# Patient Record
Sex: Male | Born: 1985 | Race: White | Hispanic: No | Marital: Married | Smoking: Never smoker
Health system: Southern US, Community
[De-identification: ages and names within clinical notes are randomized; demographics above are authoritative.]

## PROBLEM LIST (undated history)

## (undated) DIAGNOSIS — B019 Varicella without complication: Secondary | ICD-10-CM

## (undated) HISTORY — DX: Varicella without complication: B01.9

---
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
## 2015-04-23 ENCOUNTER — Encounter: Payer: Self-pay | Admitting: Family Medicine

## 2015-04-23 ENCOUNTER — Other Ambulatory Visit: Payer: Self-pay | Admitting: *Deleted

## 2015-04-23 ENCOUNTER — Ambulatory Visit (INDEPENDENT_AMBULATORY_CARE_PROVIDER_SITE_OTHER)
Admission: RE | Admit: 2015-04-23 | Discharge: 2015-04-23 | Disposition: A | Discharge status: Home / Self Care | Payer: BLUE CROSS/BLUE SHIELD | Source: Ambulatory Visit | Attending: Family Medicine | Admitting: Family Medicine

## 2015-04-23 ENCOUNTER — Other Ambulatory Visit (INDEPENDENT_AMBULATORY_CARE_PROVIDER_SITE_OTHER): Payer: BLUE CROSS/BLUE SHIELD

## 2015-04-23 ENCOUNTER — Ambulatory Visit (INDEPENDENT_AMBULATORY_CARE_PROVIDER_SITE_OTHER): Payer: BLUE CROSS/BLUE SHIELD | Admitting: Family Medicine

## 2015-04-23 VITALS — BP 106/72 | HR 63 | Ht 73.0 in | Wt 231.0 lb

## 2015-04-23 DIAGNOSIS — M5412 Radiculopathy, cervical region: Secondary | ICD-10-CM | POA: Diagnosis not present

## 2015-04-23 DIAGNOSIS — M25512 Pain in left shoulder: Secondary | ICD-10-CM

## 2015-04-23 DIAGNOSIS — M542 Cervicalgia: Secondary | ICD-10-CM | POA: Diagnosis not present

## 2015-04-23 MED ORDER — GABAPENTIN 100 MG PO CAPS: 100.0000 mg | ORAL_CAPSULE | Freq: Every day | ORAL | Status: DC

## 2015-04-23 MED ORDER — PREDNISONE 50 MG PO TABS: 50.0000 mg | ORAL_TABLET | Freq: Every day | ORAL | Status: DC

## 2015-04-23 NOTE — Progress Notes (Signed)
Tawana ScaleZach Smith D.O. [facility] [address] [place], [place] [address] Phone: [phone] Subjective:     CC: Shoulder pain, left  OZH:YQMVHQIONGHPI:Subjective Cyndee BrightlyJohn M Polio III is a 29 y.o. male coming in with complaint of shoulder pain.  This is left-sided. Patient states that this is been more of a 4-6 weeks in an insidious onset. Patient started having the difficulty and started going to a physical therapist. Patient was given some exercises and states that it may have been minorly beneficial but now it seems to be worsening again. Patient states that now he is also having constant numbness in the thumb and index finger on the left arm. Patient states that during the morning he seems to do relatively well but by the end of the day he has severe pain. States that the severity can be 7 out of 10. Denies any weakness. Patient states that certain range of motion of the neck or the shoulder can give him some mild increase in discomfort. Does respond anti-inflammatory's when he does taken. Patient would like to remain active but is concerned because of a constant numbness.  Past Medical History  Diagnosis Date  . Chicken pox    Past Surgical History  Procedure Laterality Date  . Nasal sinus surgery  2002   History  Substance Use Topics  . Smoking status: Never Smoker   . Smokeless tobacco: Never Used  . Alcohol Use: 0.0 oz/week    0 Standard drinks or equivalent per week   Not on File Family History  Problem Relation Age of Onset  . Hypertension Maternal Grandmother   . Cancer Maternal Grandfather   . Hypertension Maternal Grandfather   . Hypertension Paternal Grandmother   . Cancer Paternal Grandfather   . Hypertension Paternal Grandfather         Past medical history, social, surgical and family history all reviewed in electronic medical record.   Review of Systems: No headache, visual changes, nausea, vomiting, diarrhea, constipation, dizziness, abdominal pain, skin rash,  fevers, chills, night sweats, weight loss, swollen lymph nodes, body aches, joint swelling, muscle aches, chest pain, shortness of breath, mood changes.   Objective Blood pressure 106/72, pulse 63, height 6\' 1"  (1.854 m), weight 231 lb (104.781 kg), SpO2 98 %.  General: No apparent distress alert and oriented x3 mood and affect normal, dressed appropriately.  HEENT: Pupils equal, extraocular movements intact  Respiratory: Patient's speak in full sentences and does not appear short of breath  Cardiovascular: No lower extremity edema, non tender, no erythema  Skin: Warm dry intact with no signs of infection or rash on extremities or on axial skeleton.  Abdomen: Soft nontender  Neuro: Cranial nerves II through XII are intact, neurovascularly intact in all extremities with 2+ DTRs and 2+ pulses.  Lymph: No lymphadenopathy of posterior or anterior cervical chain or axillae bilaterally.  Gait normal with good balance and coordination.  MSK:  Non tender with full range of motion and good stability and symmetric strength and tone of  elbows, wrist, hip, knee and ankles bilaterally.  Shoulder: Left Inspection reveals no abnormalities, atrophy or asymmetry. Palpation is normal with no tenderness over AC joint or bicipital groove. Patient does have increasing range of motion but this is symmetric. Significant more internal range of motion and normal bilaterally. Rotator cuff strength normal throughout. No signs of impingement with negative Neer and Hawkin's tests, empty can sign. Speeds and Yergason's tests normal. No labral pathology noted with negative Obrien's, negative clunk  and good stability. Normal scapular function observed. No painful arc and no drop arm sign. No apprehension sign Collateral shoulder also shows increasing range of motion but otherwise unremarkable.  Neck: Inspection unremarkable. No palpable stepoffs. Mildly positive Spurling's maneuver with radicular symptoms in the C6  distribution Full neck range of motion Grip strength and sensation normal in bilateral hands Strength good C4 to T1 distribution No sensory change to C4 to T1 Negative Hoffman sign bilaterally Reflexes normal  MSK US performed of: Left shoulder This study was ordered, performed, and interpreted by Terrilee Files D.O.  Shoulder:   Supraspinatus:  Appears normal on long and transverse views, no bursal bulge seen with shoulder abduction on impingement view. Infraspinatus:  Appears normal on long and transverse views. Subscapularis: Mild increasing upper flow but no tear and minimal hypoechoic changes. Teres Minor:  Appears normal on long and transverse views. AC joint:  Capsule undistended, no geyser sign. Glenohumeral Joint:  Appears normal without effusion. Glenoid Labrum:  Intact without visualized tears. Biceps Tendon:  Appears normal on long and transverse views, no fraying of tendon, tendon located in intertubercular groove, no subluxation with shoulder internal or external rotation. No increased power doppler signal. Impression: Minimal rotator cuff syndrome  Procedure note 97110; 15 minutes spent for Therapeutic exercises as stated in above notes.  This included exercises focusing on stretching, strengthening, with significant focus on eccentric aspects. Shoulder Exercises that included:  Basic scapular stabilization to include adduction and depression of scapula Scaption, focusing on proper movement and good control Internal and External rotation utilizing a theraband, with elbow tucked at side entire time Rows with theraband  Proper technique shown and discussed handout in great detail with ATC.  All questions were discussed and answered.     Impression and Recommendations:     This case required medical decision making of moderate complexity.

## 2015-04-23 NOTE — Patient Instructions (Addendum)
Good to see you Xray downstairs Exercises 3 times a week.  Keep monitor at eye level Tennisball between shoulder blades with sitting.  Prednisone daily for 5 days  Gabapentin  at night See me again in 2 weeks to make sure you are better  Standing:  Secure a rubber exercise band/tubing so that it is at the height of your shoulders when you are either standing or sitting on a firm arm-less chair.  Grasp an end of the band/tubing in each hand and have your palms face each other. Straighten your elbows and lift your hands straight in front of you at shoulder height. Step back away from the secured end of band/tubing until it becomes tense.  Squeeze your shoulder blades together. Keeping your elbows locked and your hands at shoulder-height, bring your hands out to your side.  Hold __________ seconds. Slowly ease the tension on the band/tubing as you reverse the directions and return to the starting position. Repeat __________ times. Complete this exercise __________ times per day. STRENGTH - Scapular Retractors  Secure a rubber exercise band/tubing so that it is at the height of your shoulders when you are either standing or sitting on a firm arm-less chair.  With a palm-down grip, grasp an end of the band/tubing in each hand. Straighten your elbows and lift your hands straight in front of you at shoulder height. Step back away from the secured end of band/tubing until it becomes tense.  Squeezing your shoulder blades together, draw your elbows back as you bend them. Keep your upper arm lifted away from your body throughout the exercise.  Hold __________ seconds. Slowly ease the tension on the band/tubing as you reverse the directions and return to the starting position. Repeat __________ times. Complete this exercise __________ times per day. STRENGTH - Shoulder Extensors   Secure a rubber exercise band/tubing so that it is at the height of your shoulders when you are either standing  or sitting on a firm arm-less chair.  With a thumbs-up grip, grasp an end of the band/tubing in each hand. Straighten your elbows and lift your hands straight in front of you at shoulder height. Step back away from the secured end of band/tubing until it becomes tense.  Squeezing your shoulder blades together, pull your hands down to the sides of your thighs. Do not allow your hands to go behind you.  Hold for __________ seconds. Slowly ease the tension on the band/tubing as you reverse the directions and return to the starting position. Repeat __________ times. Complete this exercise __________ times per day.  STRENGTH - Scapular Retractors and External Rotators  Secure a rubber exercise band/tubing so that it is at the height of your shoulders when you are either standing or sitting on a firm arm-less chair.  With a palm-down grip, grasp an end of the band/tubing in each hand. Bend your elbows 90 degrees and lift your elbows to shoulder height at your sides. Step back away from the secured end of band/tubing until it becomes tense.  Squeezing your shoulder blades together, rotate your shoulder so that your upper arm and elbow remain stationary, but your fists travel upward to head-height.  Hold __________ for seconds. Slowly ease the tension on the band/tubing as you reverse the directions and return to the starting position. Repeat __________ times. Complete this exercise __________ times per day.  STRENGTH - Scapular Retractors and External Rotators, Rowing  Secure a rubber exercise band/tubing so that it is at the height of  your shoulders when you are either standing or sitting on a firm arm-less chair.  With a palm-down grip, grasp an end of the band/tubing in each hand. Straighten your elbows and lift your hands straight in front of you at shoulder height. Step back away from the secured end of band/tubing until it becomes tense.  Step 1: Squeeze your shoulder blades together. Bending  your elbows, draw your hands to your chest as if you are rowing a boat. At the end of this motion, your hands and elbow should be at shoulder-height and your elbows should be out to your sides.  Step 2: Rotate your shoulder to raise your hands above your head. Your forearms should be vertical and your upper-arms should be horizontal.  Hold for __________ seconds. Slowly ease the tension on the band/tubing as you reverse the directions and return to the starting position. Repeat __________ times. Complete this exercise __________ times per day.  STRENGTH - Scapular Retractors and Elevators  Secure a rubber exercise band/tubing so that it is at the height of your shoulders when you are either standing or sitting on a firm arm-less chair.  With a thumbs-up grip, grasp an end of the band/tubing in each hand. Step back away from the secured end of band/tubing until it becomes tense.  Squeezing your shoulder blades together, straighten your elbows and lift your hands straight over your head.  Hold for __________ seconds. Slowly ease the tension on the band/tubing as you reverse the directions and return to the starting position. Repeat __________ times. Complete this exercise __________ times per day.  Document Released: 11/07/2005 Document Revised: 01/30/2012 Document Reviewed: 02/19/2009 [facility] Patient Information 2015 [employer] [place]. This information is not intended to replace advice given to you by your health care provider. Make sure you discuss any questions you have with your health care provider.

## 2015-04-23 NOTE — Assessment & Plan Note (Signed)
Patient is having symptoms that seemed to correspond more with the C6 distribution as well as having some constant numbness of the hand. Patient continues to have pain that seems to be potentially waking him up at night as well. Seems to be concerning with a progressing over the course last month. Patient does not remember any true injury. Patient has tried anti-inflammatory's which is beneficial but he's been taking and constantly at night. I would like to treat him for potential nerve root impingement. Patient is going to be treated with prednisone and given gabapentin as well. Patient given other exercises to help with scapular stabilization due to patient's increasing range of motion of the shoulders bilaterally. We discussed ergonomics at work that could be beneficial. Patient then will come back and see me again in 2 weeks. X-rays ordered of the cervical spine to evaluate further. If patient has any worsening symptoms or no improvement with the conservative therapy advance imaging may be warranted.

## 2015-04-23 NOTE — Progress Notes (Signed)
Pre visit review using our clinic review tool, if applicable. No additional management support is needed unless otherwise documented below in the visit note. 

## 2015-04-28 ENCOUNTER — Encounter: Payer: Self-pay | Admitting: Family Medicine

## 2015-04-29 ENCOUNTER — Ambulatory Visit: Payer: Self-pay | Admitting: Family Medicine

## 2015-05-11 ENCOUNTER — Encounter: Payer: Self-pay | Admitting: Family Medicine

## 2015-05-11 ENCOUNTER — Ambulatory Visit (INDEPENDENT_AMBULATORY_CARE_PROVIDER_SITE_OTHER): Payer: BLUE CROSS/BLUE SHIELD | Admitting: Family Medicine

## 2015-05-11 VITALS — BP 118/78 | HR 72 | Ht 73.0 in | Wt 230.0 lb

## 2015-05-11 DIAGNOSIS — G540 Brachial plexus disorders: Secondary | ICD-10-CM

## 2015-05-11 DIAGNOSIS — M9902 Segmental and somatic dysfunction of thoracic region: Secondary | ICD-10-CM | POA: Diagnosis not present

## 2015-05-11 DIAGNOSIS — M9901 Segmental and somatic dysfunction of cervical region: Secondary | ICD-10-CM

## 2015-05-11 DIAGNOSIS — M999 Biomechanical lesion, unspecified: Secondary | ICD-10-CM | POA: Insufficient documentation

## 2015-05-11 DIAGNOSIS — M9908 Segmental and somatic dysfunction of rib cage: Secondary | ICD-10-CM | POA: Diagnosis not present

## 2015-05-11 NOTE — Patient Instructions (Addendum)
Good to see you Ice is your friend Vitamin D 2000 IU daily B12 1000-mcg daily B6  daily Continue the exercises and try to incorporate new ones Hope manipulation helps See me again in 3 weeks.   Thoracic Outlet Syndrome  with Rehab Thoracic outlet syndrome is a condition in which nerves, and vessels (less common), are compressed or squeezed in the chest (thoracic) cavity. This results in pain and weakness in the shoulders, arms, and hands.  SYMPTOMS   Signs of nerve damage: pain, numbness, and tingling in the arm or hand.  Arm and hand weakness.  Signs of vascular damage: coldness, inflammation, blue or pale color in the hand and fingers (uncommon). CAUSES  Thoracic outlet syndrome is caused by the squeezing of nerves, and possibly vessels, in the chest cavity, before they extend into the arm and hand. Just before leaving the chest cavity, the bundle of nerves and vessels (brachial plexus) passes by the collarbone (clavicle) and ribs. In this area, the bundle of nerves can come under increased pressure, which may result in thoracic outlet syndrome. Common sources of pressure include:  Rib cage.  Fracture of the first rib or clavicle.  Neck muscles that are too large.  Extending the arm above the head for a long period of time (during sleep or while unconscious).  Tumor (rare). RISK INCREASES WITH:  Break (fracture) of the clavicle or first rib.  Neck muscles that become too large from bodybuilding.  Fast weight loss combined with vigorous physical exercise. PREVENTION  Avoid activities where shoulder or neck injury are likely.  Wear properly fitted and padded protective equipment.  Maintain good posture.  Avoid carrying a bag or backpack that places pressure on the affected side.  Change sleeping positions. Try sleeping on one side, or sleep without a firm pillow.  Avoid building large neck muscles. PROGNOSIS  If treated properly, symptoms of thoracic outlet  syndrome normally go away with nonsurgical treatment. Sometimes, surgery is necessary to free the bundle of nerves from pressure.  RELATED COMPLICATIONS   Permanent nerve damage, including pain, numbness, tingling, or weakness.  Recurring symptoms that result in an ongoing problem.  Clotting (thrombosis) of the axillary vein in the shoulder. This is an emergency that needs to be treated immediately.  Risks of surgery: infection, bleeding, nerve damage, or damage to surrounding tissues. TREATMENT  Treatment first involves resting from activities that aggravate the symptoms and the use of ice and medicine to reduce pain and inflammation. The use of strengthening and stretching exercises may help reduce pain with activity. These exercises may be performed at home or with a therapist. It is important to improve posture and adjust sleeping habits to reduce the symptoms. If symptoms continue for more than 6 months despite nonsurgical treatment, surgery may be recommended. MEDICATION   If pain medicine is necessary, nonsteroidal anti-inflammatory medicines (aspirin and ibuprofen), or other minor pain relievers (acetaminophen), are often recommended.  Do not take pain medicine for 7 days before surgery.  Prescription pain relievers may be given if your caregiver thinks they are necessary. Use only as directed and only as much as you need. SEEK MEDICAL CARE IF:   Treatment does not help, or the condition gets worse.  Any medicines produce negative side effects.  Any complications from surgery occur:  Pain, numbness, or coldness in the affected arm and hand.  Discoloration beneath the fingernails (blue or gray) of the affected hand.  Signs of infections: fever, pain, inflammation, redness, or persistent bleeding.  EXERCISES RANGE OF MOTION (ROM) AND STRETCHING EXERCISES - Thoracic Outlet Syndrome These exercises may help you when beginning to recover from your injury. In order to successfully  stop your symptoms, you must improve your posture. These exercises are designed to help reduce the forward-head and rounded-shoulder posture that contributes to this condition. Your symptoms may go away with or without further involvement from your physician, physical therapist or athletic trainer. While completing these exercises, remember:   Restoring tissue flexibility helps return normal motion to the joints. This allows healthier, less painful movement and activity.  An effective stretch should be held for at least 30 seconds. Neck muscles are easily irritated, even if they do not seem to be bothered at the time of an exercise. It is often best to begin your stretches with hold times as short as 5 seconds and build up gradually.  A stretch should never be painful. You should only feel a gentle lengthening or release in the stretched tissue. STRETCH - Axial Extension  Stand or sit on a firm surface. Assume a good posture: chest up, shoulders drawn back, abdominal (stomach) muscles slightly tense, knees unlocked (if standing), and feet hip width apart.  Slowly pull your chin back, so your head slides back and your chin slightly lowers. Continue to look straight ahead.  You should feel a gentle stretch in the back of your head. You should be certain not to feel a strong stretch, since this can cause headaches later.  Hold for __________ seconds. Repeat __________ times. Complete this exercise __________ times per day. RANGE OF MOTION- Upper Thoracic Extension  Sit on a firm chair with a high back. Assume a good posture: chest up, shoulders drawn back, abdominal muscles slightly tense, and feet hip width apart. Place a small pillow or folded towel in the curve of your lower back, if you are having difficulty maintaining good posture.  Gently brace your neck with your hands, allowing your arms to rest on your chest.  Continue to support your neck and slowly extend your back over the chair. You  will feel a stretch across your upper back.  Hold __________ seconds. Slowly return to the starting position. Repeat __________ times. Complete this exercise __________ times per day. STRETCH - Mid-thoracic Extension  Tape two tennis balls together, or secure them side-by-side in a sock.  Find a firm surface to lie on. Position the tennis balls so that one lies on each side of your spine, and they are both between your shoulder blades. You may bend your knees if you choose.  Remain in this position for 20 to 30 seconds. Gently move your body 1 to 2 inches up (in the direction of your head), so that the balls now roll on a slightly lower part of your back. Hold this position another 20-30 seconds. Continue to move the balls down your spine until you no longer feel a stretching sensation. Repeat exercise __________ times, __________ times per day. STRENGTHENING EXERCISES - Thoracic Outlet Syndrome These exercises may help you when beginning to recover from your injury. In order to successfully stop your symptoms, you must improve your posture. These exercises are designed to help reduce the forward-head and rounded-shoulder posture that contributes to this condition. Your symptoms may go away with or without further involvement from your physician, physical therapist or athletic trainer. While completing these exercises, remember:   Muscles can gain both the endurance and the strength needed for everyday activities through controlled exercises.  Complete these  exercises as instructed by your physician, physical therapist, or athletic trainer. Increase the resistance and repetitions only as guided.  You may experience muscle soreness or fatigue, but the pain or discomfort you are trying to eliminate should never worsen during these exercises. If this pain does get worse, stop and make sure you are following the directions exactly. If the pain is still present after adjustments, stop the exercise  until you can discuss the trouble with your caretaker. STRENGTH - Scapular Retractors  Secure a rubber exercise band or tubing around a fixed object (i.e., table, pole), so that it is at the height of your shoulders when you are either standing, or sitting on a firm, armless chair.  With a palm-down grip, grasp an end of the band in each hand. Straighten your elbows out and lift your hands straight in front of you at shoulder height. Step back, away from the secured end of the band, until it becomes tense.  Squeezing your shoulder blades together, draw your elbows back as you bend them. Keep your upper arm lifted up, away from the sides of your body, throughout the exercise.  Hold __________ seconds. Slowly ease the tension on the band as you reverse the directions and return to the starting position. Repeat __________ times. Complete this exercise __________ times per day. POSTURE - Thoracic Outlet Syndrome Keeping correct posture when sitting, standing, or completing your activities will reduce the stress put on different body tissues. This will allow injured tissues a chance to heal and limit painful experiences. The following are general guidelines for improved posture. Your physician or physical therapist will provide you with any instructions specific to your needs. While reading these guidelines, remember:  The exercises prescribed by your caregiver will help you develop the flexibility and strength to maintain correct postures.  Correct posture provides the best environment for your joints to work. All your joints have less wear and tear when properly supported by a spine with good posture. This means you will experience a healthier, less painful body.  Correct posture must be practiced with all of your activities, especially prolonged sitting and standing. Correct posture is as important during repetitive, low-stress activities (i.e., typing) as it is when doing a single, heavy-load  activity (i.e., lifting). PROLONGED STANDING WHILE SLIGHTLY LEANING FORWARD When completing a task that requires you to lean forward, while standing in one place for a long time, place one foot up on a stationary 2- to 4-inch-high object to help maintain the best posture. When both feet are on the ground, the low back tends to lose its slight inward curve. If this curve flattens (or becomes too large), then the back and your other joints will experience too much stress, tire more quickly, and can cause pain.  PROLONGED ACTIVITY IN A FLEXED POSITION When completing a task that requires you to bend forward at your waist or lean over a low surface, find a way to stabilize 3 out of 4 of your limbs. You can place a hand or elbow on your thigh or rest a knee on the surface you are reaching across. This will provide you more stability so that your muscles do not tire as quickly. By keeping your knees relaxed, or slightly bent, you will also reduce stress across your low back. WALKING Walk with an upright posture. Your ears, shoulders, and hips should all line up. OFFICE WORK When working at a desk, create an environment that supports good, upright posture. Without extra support, muscles  tire, leading to too much strain on joints and other tissues.  CHAIR   A chair should be able to slide under your desk when your back makes contact with the back of the chair. This allows you to work closely.  The chair's height should allow your eyes to be level with the upper part of your monitor and your hands to be slightly lower than your elbows. BODY POSITION  Your feet should make contact with the floor. If this is not possible, use a footrest.  Keep your ears aligned over your shoulders. This will reduce stress on your neck and low back. Document Released: 11/07/2005 Document Revised: 03/24/2014 Document Reviewed: 02/19/2009 [facility] Patient Information 2015 [place], [place]. This information is not intended to  replace advice given to you by your health care provider. Make sure you discuss any questions you have with your health care provider.

## 2015-05-11 NOTE — Progress Notes (Signed)
[name] [address] [place], [place] [address] Phone: [phone] Subjective:     CC: Shoulder pain, left  [id number] Brian Carlson is a 29 y.o. male coming in with complaint of shoulder pain.  This is left-sided. Patient was seen previously and was found to have a very mild rotator cuff syndrome and was concern for more of a cervical radiculopathy. Patient though did have x-rays of his cervical spine which were unremarkable. Patient was treated for the cervical radiculopathy with prednisone as well as gabapentin to take at night. Patient states  Overall he has made some minimal improvement. Patient still is having the numbness. Patient states that the pain is completely resolved after the prednisone. Patient continues to gabapentin but unfortunately had side effects  And stopped the medication. Patient states that he can do all daily activities and is working out fairly regularly. Denies any new symptoms  Past Medical History  Diagnosis Date  . Chicken pox    Past Surgical History  Procedure Laterality Date  . Nasal sinus surgery  2002   History  Substance Use Topics  . Smoking status: Never Smoker   . Smokeless tobacco: Never Used  . Alcohol Use: 0.0 oz/week    0 Standard drinks or equivalent per week   Not on File Family History  Problem Relation Age of Onset  . Hypertension Maternal Grandmother   . Cancer Maternal Grandfather   . Hypertension Maternal Grandfather   . Hypertension Paternal Grandmother   . Cancer Paternal Grandfather   . Hypertension Paternal Grandfather         Past medical history, social, surgical and family history all reviewed in electronic medical record.   Review of Systems: No headache, visual changes, nausea, vomiting, diarrhea, constipation, dizziness, abdominal pain, skin rash, fevers, chills, night sweats, weight loss, swollen lymph nodes, body aches, joint swelling, muscle aches, chest pain, shortness  of breath, mood changes.   Objective Blood pressure 118/78, pulse 72, height 6\' 1"  (1.854 m), weight 230 lb (104.327 kg), SpO2 98 %.  General: No apparent distress alert and oriented x3 mood and affect normal, dressed appropriately.  HEENT: Pupils equal, extraocular movements intact  Respiratory: Patient's speak in full sentences and does not appear short of breath  Cardiovascular: No lower extremity edema, non tender, no erythema  Skin: Warm dry intact with no signs of infection or rash on extremities or on axial skeleton.  Abdomen: Soft nontender  Neuro: Cranial nerves II through XII are intact, neurovascularly intact in all extremities with 2+ DTRs and 2+ pulses.  Lymph: No lymphadenopathy of posterior or anterior cervical chain or axillae bilaterally.  Gait normal with good balance and coordination.  MSK:  Non tender with full range of motion and good stability and symmetric strength and tone of  elbows, wrist, hip, knee and ankles bilaterally.  Shoulder: Left Inspection reveals no abnormalities, atrophy or asymmetry. Palpation is normal with no tenderness over AC joint or bicipital groove. Patient does have increasing range of motion but this is symmetric. Significant more internal range of motion and normal bilaterally.mild increase in symptomswith shoulder extension Rotator cuff strength normal throughout. No signs of impingement with negative Neer and Hawkin's tests, empty can sign. Speeds and Yergason's tests normal. No labral pathology noted with negative Obrien's, negative clunk and good stability. Normal scapular function observed. No painful arc and no drop arm sign. No apprehension sign Collateral shoulder also shows increasing range of motion but otherwise unremarkable.  No significant change from previous exam  Neck: Inspection unremarkable. No palpable stepoffs. Minimal pain with Spurling's and no radiculopathy. Full neck range of motion Grip strength and sensation  normal in bilateral hands Strength good C4 to T1 distribution No sensory change to C4 to T1 Negative Hoffman sign bilaterally Reflexes normal  Osteopathic findings.  Cervical C2 F RS right Thoracic T3 E RS left inhaled 3rd rib T5 E RS right Lumbar  L2 F RS right     Impression and Recommendations:     This case required medical decision making of moderate complexity.

## 2015-05-11 NOTE — Assessment & Plan Note (Signed)
Decision today to treat with OMT was based on Physical Exam  After verbal consent patient was treated with HVLA, ME techniques in cervical, thoracic and rib areas  Patient tolerated the procedure well with improvement in symptoms  Patient given exercises, stretches and lifestyle modifications  See medications in patient instructions if given  Patient will follow up in 3 weeks  

## 2015-05-11 NOTE — Assessment & Plan Note (Signed)
With patient cervical spine on x-ray been fairly unremarkable and patient having more of a negative Spurling's today do not think that further advanced imaging is warranted at this time. Limited treat him more as a thoracic outlet syndrome. We discussed home exercises, icing protocol, and what activities to potentially avoid. Patient will continue with the natural supplementations and patient was given new suggestions. Patient is accompanied by wife today that could be helpful as well. Patient and will come back and see me again in 3 weeks. Patient did respond well to osteopathic manipulation.

## 2015-05-19 ENCOUNTER — Encounter: Payer: Self-pay | Admitting: Family Medicine

## 2015-06-01 ENCOUNTER — Ambulatory Visit (INDEPENDENT_AMBULATORY_CARE_PROVIDER_SITE_OTHER): Payer: BLUE CROSS/BLUE SHIELD | Admitting: Family Medicine

## 2015-06-01 ENCOUNTER — Encounter: Payer: Self-pay | Admitting: Family Medicine

## 2015-06-01 VITALS — BP 116/82 | HR 69 | Ht 73.0 in | Wt 233.0 lb

## 2015-06-01 DIAGNOSIS — G540 Brachial plexus disorders: Secondary | ICD-10-CM | POA: Diagnosis not present

## 2015-06-01 DIAGNOSIS — M9908 Segmental and somatic dysfunction of rib cage: Secondary | ICD-10-CM | POA: Diagnosis not present

## 2015-06-01 DIAGNOSIS — M9902 Segmental and somatic dysfunction of thoracic region: Secondary | ICD-10-CM | POA: Diagnosis not present

## 2015-06-01 DIAGNOSIS — M999 Biomechanical lesion, unspecified: Secondary | ICD-10-CM

## 2015-06-01 DIAGNOSIS — M9901 Segmental and somatic dysfunction of cervical region: Secondary | ICD-10-CM | POA: Diagnosis not present

## 2015-06-01 NOTE — Assessment & Plan Note (Signed)
Decision today to treat with OMT was based on Physical Exam  After verbal consent patient was treated with HVLA, ME techniques in cervical, thoracic and rib areas  Patient tolerated the procedure well with improvement in symptoms  Patient given exercises, stretches and lifestyle modifications  See medications in patient instructions if given  Patient will follow up in 3-4 weeks      

## 2015-06-01 NOTE — Assessment & Plan Note (Signed)
Patient overall is doing significantly about the same. Denise weakness but does feel like it falls asleep.  Concern still for possible cervical radiculopathy is also be contribute in. Patient may need workup including MRI of the cervical spine as well as a special MRI to rule out thoracic outlet syndrome. We discussed with patient that we should continue with the conservative therapy at this time. Patient will try gabapentin again but it has any significant side effects again we will try to switch to nortriptyline to allow for her titration. Patient will continue to monitor what other activity seems to give him more trouble. It seems that internal range of motion seems to be the worst.

## 2015-06-01 NOTE — Progress Notes (Signed)
[name] [address] [place], [place] [address] Phone: [phone] Subjective:     CC: Shoulder pain, left  [id number] Brian Carlson is a 29 y.o. male coming in with complaint of shoulder pain.  This is left-sided. Patient was seen previously and was found to have a very mild rotator cuff syndrome and was concern for more of a cervical radiculopathy. Patient though did have x-rays of his cervical spine which were unremarkable. Patient was treated for the cervical radiculopathy with prednisone as well as gabapentin to take at night. Patient was unable to tolerate the gabapentin. Patient states that it made him feel funny. Patient continues to be active playing golf as well as swimming but this unfortunately makes the problem worse. Patient is now being treated more for thoracic outlet syndrome without any significant improvement. Patient states that the manipulation does help with the radicular symptoms but unfortunately the pain in the abnormal feeling is still there.  Past Medical History  Diagnosis Date  . Chicken pox    Past Surgical History  Procedure Laterality Date  . Nasal sinus surgery  2002   History  Substance Use Topics  . Smoking status: Never Smoker   . Smokeless tobacco: Never Used  . Alcohol Use: 0.0 oz/week    0 Standard drinks or equivalent per week   Not on File Family History  Problem Relation Age of Onset  . Hypertension Maternal Grandmother   . Cancer Maternal Grandfather   . Hypertension Maternal Grandfather   . Hypertension Paternal Grandmother   . Cancer Paternal Grandfather   . Hypertension Paternal Grandfather         Past medical history, social, surgical and family history all reviewed in electronic medical record.   Review of Systems: No headache, visual changes, nausea, vomiting, diarrhea, constipation, dizziness, abdominal pain, skin rash, fevers, chills, night sweats, weight loss, swollen lymph nodes,  body aches, joint swelling, muscle aches, chest pain, shortness of breath, mood changes.   Objective Blood pressure 116/82, pulse 69, height  (1.854 m), weight 233 lb (105.688 kg), SpO2 97 %.  General: No apparent distress alert and oriented x3 mood and affect normal, dressed appropriately.  HEENT: Pupils equal, extraocular movements intact  Respiratory: Patient's speak in full sentences and does not appear short of breath  Cardiovascular: No lower extremity edema, non tender, no erythema  Skin: Warm dry intact with no signs of infection or rash on extremities or on axial skeleton.  Abdomen: Soft nontender  Neuro: Cranial nerves II through XII are intact, neurovascularly intact in all extremities with 2+ DTRs and 2+ pulses.  Lymph: No lymphadenopathy of posterior or anterior cervical chain or axillae bilaterally.  Gait normal with good balance and coordination.  MSK:  Non tender with full range of motion and good stability and symmetric strength and tone of  elbows, wrist, hip, knee and ankles bilaterally.  Shoulder: Left Inspection reveals no abnormalities, atrophy or asymmetry. Palpation is normal with no tenderness over AC joint or bicipital groove. Patient does have increasing range of motion but this is symmetric. Significant more internal range of motion and normal bilaterally.mild increase in symptomswith shoulder extension Rotator cuff strength normal throughout. No signs of impingement with negative Neer and Hawkin's tests, empty can sign. Speeds and Yergason's tests normal. No labral pathology noted with negative Obrien's, negative clunk and good stability. Normal scapular function observed. No painful arc and no drop arm sign. No apprehension sign Collateral shoulder  also shows increasing range of motion but otherwise unremarkable. No significant change from previous exam  Neck: Inspection unremarkable. No palpable stepoffs. Minimal pain with Spurling's and mild  radiculopathy down the C6 on left Full neck range of motion Grip strength and sensation normal in bilateral hands Strength good C4 to T1 distribution No sensory change to C4 to T1 Negative Hoffman sign bilaterally Reflexes normal  Osteopathic findings.  Cervical C2 F RS right Thoracic T3 E RS left inhaled 3rd rib T5 E RS right Lumbar  L2 F RS right     Impression and Recommendations:     This case required medical decision making of moderate complexity.

## 2015-06-01 NOTE — Patient Instructions (Signed)
Watch over the next 48 hours and see if manipulation is helping Try the gabapentin  Only backstroke for now Continue the exercises 3 times a week See me again in 4 weeks.

## 2015-06-01 NOTE — Progress Notes (Signed)
Pre visit review using our clinic review tool, if applicable. No additional management support is needed unless otherwise documented below in the visit note. 

## 2015-07-06 ENCOUNTER — Encounter: Payer: Self-pay | Admitting: Family Medicine

## 2015-07-06 ENCOUNTER — Ambulatory Visit (INDEPENDENT_AMBULATORY_CARE_PROVIDER_SITE_OTHER): Payer: BLUE CROSS/BLUE SHIELD | Admitting: Family Medicine

## 2015-07-06 VITALS — BP 118/70 | HR 63 | Ht 73.0 in | Wt 233.0 lb

## 2015-07-06 DIAGNOSIS — M9908 Segmental and somatic dysfunction of rib cage: Secondary | ICD-10-CM

## 2015-07-06 DIAGNOSIS — G540 Brachial plexus disorders: Secondary | ICD-10-CM

## 2015-07-06 DIAGNOSIS — M9901 Segmental and somatic dysfunction of cervical region: Secondary | ICD-10-CM

## 2015-07-06 DIAGNOSIS — M9902 Segmental and somatic dysfunction of thoracic region: Secondary | ICD-10-CM

## 2015-07-06 DIAGNOSIS — M999 Biomechanical lesion, unspecified: Secondary | ICD-10-CM

## 2015-07-06 NOTE — Assessment & Plan Note (Signed)
Patient continues to respond well to manipulation therapy for this. Encourage patient to continue to work on the scapular exercises and will opening up the chest wall. Patient will remain active and I think she will continue to do well. Patient has not taken any pain medication at this time. Patient has any worsening symptoms he will call me otherwise will come back again in 6 weeks for further evaluation and treatment.

## 2015-07-06 NOTE — Assessment & Plan Note (Signed)
Decision today to treat with OMT was based on Physical Exam  After verbal consent patient was treated with HVLA, ME techniques in cervical, thoracic and rib areas  Patient tolerated the procedure well with improvement in symptoms  Patient given exercises, stretches and lifestyle modifications  See medications in patient instructions if given  Patient will follow up in 6 weeks        

## 2015-07-06 NOTE — Progress Notes (Signed)
[name] [address] [place], [place] [address] Phone: [phone] Subjective:     CC: Shoulder pain, left  BJY:Brian Carlson Brian Carlson is a 29 y.o. male coming in with complaint of shoulder pain.  This is left-sided. Patient was seen previously and was found to have a very mild rotator cuff syndrome and was concern for more of a cervical radiculopathy. Patient though did have x-rays of his cervical spine which were unremarkable. Patient was treated for the cervical radiculopathy with prednisone as well as gabapentin to take at night. Patient was unable to tolerate the gabapentin. Patient states that it made him feel funny.  Patient continue to do the home exercises as well as help trying to stretch the thoracic outlet. Working on upper back musculature. Patient states overall he is doing better. States that it keeps moving in the right direction. Denies any weakness at this time and states that the numbness that he feels from time to time is improving as well. Patient is very happy with the results at this time. Patient has been on medication no and has not been doing exercises regularly continues to swim as well as play golf and not having significant trouble.  Past Medical History  Diagnosis Date  . Chicken pox    Past Surgical History  Procedure Laterality Date  . Nasal sinus surgery  2002   Social History  Substance Use Topics  . Smoking status: Never Smoker   . Smokeless tobacco: Never Used  . Alcohol Use: 0.0 oz/week    0 Standard drinks or equivalent per week   Not on File Family History  Problem Relation Age of Onset  . Hypertension Maternal Grandmother   . Cancer Maternal Grandfather   . Hypertension Maternal Grandfather   . Hypertension Paternal Grandmother   . Cancer Paternal Grandfather   . Hypertension Paternal Grandfather         Past medical history, social, surgical and family history all reviewed in electronic medical  record.   Review of Systems: No headache, visual changes, nausea, vomiting, diarrhea, constipation, dizziness, abdominal pain, skin rash, fevers, chills, night sweats, weight loss, swollen lymph nodes, body aches, joint swelling, muscle aches, chest pain, shortness of breath, mood changes.   Objective Blood pressure 118/70, pulse 63, height 6\' 1"  (1.854 m), weight 233 lb (105.688 kg), SpO2 97 %.  General: No apparent distress alert and oriented x3 mood and affect normal, dressed appropriately.  HEENT: Pupils equal, extraocular movements intact  Respiratory: Patient's speak in full sentences and does not appear short of breath  Cardiovascular: No lower extremity edema, non tender, no erythema  Skin: Warm dry intact with no signs of infection or rash on extremities or on axial skeleton.  Abdomen: Soft nontender  Neuro: Cranial nerves II through XII are intact, neurovascularly intact in all extremities with 2+ DTRs and 2+ pulses.  Lymph: No lymphadenopathy of posterior or anterior cervical chain or axillae bilaterally.  Gait normal with good balance and coordination.  MSK:  Non tender with full range of motion and good stability and symmetric strength and tone of  elbows, wrist, hip, knee and ankles bilaterally.  Shoulder: Left Inspection reveals no abnormalities, atrophy or asymmetry. Palpation is normal with no tenderness over AC joint or bicipital groove. Improvement in range of motion and continued increase internal range of motion bilaterally Rotator cuff strength normal throughout. No signs of impingement with negative Neer and Hawkin's tests, empty can sign. Speeds and  Yergason's tests normal. No labral pathology noted with negative Obrien's, negative clunk and good stability. Normal scapular function observed. No painful arc and no drop arm sign. No apprehension sign Collateral shoulder also shows increasing range of motion but otherwise unremarkable. No significant change from  previous exam  Neck: Inspection unremarkable. No palpable stepoffs. Negative Spurling's today Full neck range of motion Grip strength and sensation normal in bilateral hands Strength good C4 to T1 distribution No sensory change to C4 to T1 Negative Hoffman sign bilaterally Reflexes normal  Osteopathic findings.  Cervical C2 F RS right Thoracic T3 E RS left inhaled 3rd rib T5 E RS right T8 extended rotated and side bent left Lumbar  L2 F RS right     Impression and Recommendations:     This case required medical decision making of moderate complexity.

## 2015-07-06 NOTE — Progress Notes (Signed)
Pre visit review using our clinic review tool, if applicable. No additional management support is needed unless otherwise documented below in the visit note. 

## 2015-07-06 NOTE — Patient Instructions (Addendum)
Good to see you as always Continue whatever you are doing Watch the posture Stay active See me again in 6 weeks.

## 2015-08-18 ENCOUNTER — Ambulatory Visit: Payer: BLUE CROSS/BLUE SHIELD | Admitting: Family Medicine

## 2015-08-25 ENCOUNTER — Ambulatory Visit: Payer: BLUE CROSS/BLUE SHIELD | Admitting: Family Medicine

## 2015-08-25 DIAGNOSIS — Z0289 Encounter for other administrative examinations: Secondary | ICD-10-CM

## 2017-02-26 NOTE — Progress Notes (Deleted)
[name] [address] [place], [place] [address] Phone: [phone] Subjective:     CC: Shoulder pain, left f/u   [id number]  Brian Carlson is a 31 y.o. male coming in with complaint of shoulder pain.  This is left-sided. Patient was seen previously and was found to have a very mild rotator cuff syndrome and was concern for more of a cervical radiculopathy. Patient though did have x-rays of his cervical spine which were unremarkable. Patient was treated for the cervical radiculopathy with prednisone as well as gabapentin to take at night. Patient was unable to tolerate the gabapentin.  Patient was having more symptoms than of a thoracic outlet syndrome. Has been a year and a half since we seen patient. Was responding somewhat to osteopathic manipulation. Patient states  Past Medical History:  Diagnosis Date  . Chicken pox    Past Surgical History:  Procedure Laterality Date  . NASAL SINUS SURGERY  2002   Social History  Substance Use Topics  . Smoking status: Never Smoker  . Smokeless tobacco: Never Used  . Alcohol use 0.0 oz/week   Not on File Family History  Problem Relation Age of Onset  . Hypertension Maternal Grandmother   . Cancer Maternal Grandfather   . Hypertension Maternal Grandfather   . Hypertension Paternal Grandmother   . Cancer Paternal Grandfather   . Hypertension Paternal Grandfather         Past medical history, social, surgical and family history all reviewed in electronic medical record.   Review of Systems: No headache, visual changes, nausea, vomiting, diarrhea, constipation, dizziness, abdominal pain, skin rash, fevers, chills, night sweats, weight loss, swollen lymph nodes, body aches, joint swelling, muscle aches, chest pain, shortness of breath, mood changes.   Objective  There were no vitals taken for this visit.  General: No apparent distress alert and oriented x3 mood and affect normal, dressed  appropriately.  HEENT: Pupils equal, extraocular movements intact  Respiratory: Patient's speak in full sentences and does not appear short of breath  Cardiovascular: No lower extremity edema, non tender, no erythema  Skin: Warm dry intact with no signs of infection or rash on extremities or on axial skeleton.  Abdomen: Soft nontender  Neuro: Cranial nerves II through XII are intact, neurovascularly intact in all extremities with 2+ DTRs and 2+ pulses.  Lymph: No lymphadenopathy of posterior or anterior cervical chain or axillae bilaterally.  Gait normal with good balance and coordination.  MSK:  Non tender with full range of motion and good stability and symmetric strength and tone of  elbows, wrist, hip, knee and ankles bilaterally.  Shoulder: Left Inspection reveals no abnormalities, atrophy or asymmetry. Palpation is normal with no tenderness over AC joint or bicipital groove. Improvement in range of motion and continued increase internal range of motion bilaterally Rotator cuff strength normal throughout. No signs of impingement with negative Neer and Hawkin's tests, empty can sign. Speeds and Yergason's tests normal. No labral pathology noted with negative Obrien's, negative clunk and good stability. Normal scapular function observed. No painful arc and no drop arm sign. No apprehension sign Collateral shoulder also shows increasing range of motion but otherwise unremarkable. No significant change from previous exam  Neck: Inspection unremarkable. No palpable stepoffs. Negative Spurling's today Full neck range of motion Grip strength and sensation normal in bilateral hands Strength good C4 to T1 distribution No sensory change to C4 to T1 Negative Hoffman sign bilaterally Reflexes normal  Osteopathic  findings.  Cervical C2 F RS right Thoracic T3 E RS left inhaled 3rd rib T5 E RS right T8 extended rotated and side bent left Lumbar  L2 F RS right     Impression and  Recommendations:     This case required medical decision making of moderate complexity.

## 2017-02-27 ENCOUNTER — Ambulatory Visit: Payer: BLUE CROSS/BLUE SHIELD | Admitting: Family Medicine

## 2017-05-12 ENCOUNTER — Telehealth: Payer: BLUE CROSS/BLUE SHIELD | Admitting: Nurse Practitioner

## 2017-05-12 DIAGNOSIS — M898X1 Other specified disorders of bone, shoulder: Secondary | ICD-10-CM

## 2017-05-12 MED ORDER — PREDNISONE 10 MG (21) PO TBPK: ORAL_TABLET | ORAL | 0 refills | Status: DC

## 2017-05-12 MED ORDER — CYCLOBENZAPRINE HCL 10 MG PO TABS: 10.0000 mg | ORAL_TABLET | Freq: Three times a day (TID) | ORAL | 1 refills | Status: DC | PRN

## 2017-05-12 NOTE — Progress Notes (Signed)

## 2019-09-22 DIAGNOSIS — U071 COVID-19: Secondary | ICD-10-CM

## 2019-09-22 HISTORY — DX: COVID-19: U07.1

## 2019-12-05 ENCOUNTER — Encounter: Payer: Self-pay | Admitting: Internal Medicine

## 2019-12-05 ENCOUNTER — Telehealth: Payer: Self-pay | Admitting: Internal Medicine

## 2019-12-05 MED ORDER — ESOMEPRAZOLE MAGNESIUM 20 MG PO CPDR: 20.0000 mg | DELAYED_RELEASE_CAPSULE | Freq: Two times a day (BID) | ORAL | Status: DC

## 2019-12-05 NOTE — Telephone Encounter (Signed)
Brian Carlson has been having an epigastric pain at a low level that intensifies with eating at times about 10 minutes after eating some foods.  It got up to about a 7 or 8 last night after a meal of 1 beer chicken and vegetables.  There is no radiation to the back there is been a little bit of nausea but no vomiting.  He feels like a burning hot cold in the epigastrium at times.  Recent history notable for upper respiratory infection, he treated with ibuprofen he had headaches and low-grade fever and then also took some Mucinex.  Then a week or 2 after that he started to have these problems.  Tums and Pepto-Bismol have been slightly effective.  About 3 or 4 weeks ago he had some bad reflux symptoms which he has had off and on before particularly at night.  No dysphagia no odynophagia reported.   I suggested he try over-the-counter PPI twice daily for a couple of weeks.  If this is not working he will call back and we can schedule an in person appointment.  I think he probably has some sort of gastritis.  Ulcer also possible but what ever of these 2 it might be it should respond to PPI.  No other worrisome features reported.

## 2019-12-06 ENCOUNTER — Telehealth: Payer: Self-pay | Admitting: Internal Medicine

## 2019-12-06 MED ORDER — ONDANSETRON HCL 4 MG PO TABS: 4.0000 mg | ORAL_TABLET | Freq: Three times a day (TID) | ORAL | 1 refills | Status: DC | PRN

## 2019-12-06 NOTE — Telephone Encounter (Signed)
Having persistent nausea and otherwise about the same  Advised/reminded to take PPI bid  Will order ondansetron Will arrange f/u pending response

## 2020-02-20 ENCOUNTER — Other Ambulatory Visit: Payer: Self-pay | Admitting: Family Medicine

## 2020-02-20 ENCOUNTER — Ambulatory Visit (INDEPENDENT_AMBULATORY_CARE_PROVIDER_SITE_OTHER): Payer: BC Managed Care – PPO | Admitting: Family Medicine

## 2020-02-20 ENCOUNTER — Ambulatory Visit (INDEPENDENT_AMBULATORY_CARE_PROVIDER_SITE_OTHER): Payer: BC Managed Care – PPO

## 2020-02-20 ENCOUNTER — Other Ambulatory Visit: Payer: Self-pay

## 2020-02-20 ENCOUNTER — Encounter: Payer: Self-pay | Admitting: Family Medicine

## 2020-02-20 VITALS — BP 110/64 | HR 85 | Ht 73.0 in | Wt 254.6 lb

## 2020-02-20 DIAGNOSIS — M533 Sacrococcygeal disorders, not elsewhere classified: Secondary | ICD-10-CM

## 2020-02-20 DIAGNOSIS — M999 Biomechanical lesion, unspecified: Secondary | ICD-10-CM

## 2020-02-20 MED ORDER — MELOXICAM 15 MG PO TABS: 15.0000 mg | ORAL_TABLET | Freq: Every day | ORAL | 0 refills | Status: DC

## 2020-02-20 MED ORDER — TIZANIDINE HCL 4 MG PO TABS: 4.0000 mg | ORAL_TABLET | Freq: Every evening | ORAL | 2 refills | Status: AC

## 2020-02-20 NOTE — Patient Instructions (Signed)
Good to see you  Xray of hip and back today  Ice 20 minutes 2 times daily. Usually after activity and before bed. Exercises 3 times a week.  Meloxicam daily for 10 days then as  Needed Zanaflex at night for 3 nights then as needed See me again in 5-6 weeks

## 2020-02-20 NOTE — Assessment & Plan Note (Signed)
Sacroiliac dysfunction.  We will get x-rays though to rule out any impingement of the hip.  New problem.  Meloxicam and Zanaflex given, discussed icing regimen, patient is able to be active as tolerated.  Follow-up with me again in 4 to 8 weeks worsening pain consider SI injection, gluteal injection, formal physical therapy as potential treatment options.  Attempted osteopathic manipulation.

## 2020-02-20 NOTE — Progress Notes (Signed)
[facility] [address] [place] Phone: [phone] Subjective:   This visit occurred during the SARS-CoV-2 public health emergency.  Safety protocols were in place, including screening questions prior to the visit, additional usage of staff PPE, and extensive cleaning of exam room while observing appropriate contact time as indicated for disinfecting solutions.    CC: Low back and R hip pain  I, Molly Weber, LAT, ATC, am serving as scribe for Dr. Hulan Saas.  [id number]  Brian Carlson is a 34 y.o. male coming in with complaint of R hip and lower back pain for awhile that is associated w/ playing golf. Last seen for 07/06/2015 for thoracic outlet syndrome. He states that he typically feels ok when playing golf but has pain after golf.  On Monday, his low back spasamed after picking up a box from [employer].  He denies any numbness/tingling or weakness in his R LE.  Onset-  Location- R lower back, glute and R hip Duration-  Character- constant aching pain w/ intermittent sharp pain w/ aggravating activity Aggravating factors- Prolonged sitting; R sidelying; lumbar flexion Reliving factors-decreasing activity Therapies tried- Motrin; ice;  Severity- 3/10 at rest; 8/10 at worst     Past Medical History:  Diagnosis Date  . Chicken pox   . COVID-19 09/2019   Past Surgical History:  Procedure Laterality Date  . NASAL SINUS SURGERY  2002   Social History   Socioeconomic History  . Marital status: Married    Spouse name: Not on file  . Number of children: Not on file  . Years of education: Not on file  . Highest education level: Not on file  Occupational History  . Not on file  Tobacco Use  . Smoking status: Never Smoker  . Smokeless tobacco: Never Used  Substance and Sexual Activity  . Alcohol use: Yes    Alcohol/week: 0.0 standard drinks  . Drug use: No  . Sexual activity: Not on file  Other Topics Concern  . Not on  file  Social History Narrative   Married   One daughter   Software support business owner   Social Determinants of Health   Financial Resource Strain:   . Difficulty of Paying Living Expenses:   Food Insecurity:   . Worried About Charity fundraiser in the Last Year:   . Arboriculturist in the Last Year:   Transportation Needs:   . Film/video editor (Medical):   Marland Kitchen Lack of Transportation (Non-Medical):   Physical Activity:   . Days of Exercise per Week:   . Minutes of Exercise per Session:   Stress:   . Feeling of Stress :   Social Connections:   . Frequency of Communication with Friends and Family:   . Frequency of Social Gatherings with Friends and Family:   . Attends Religious Services:   . Active Member of Clubs or Organizations:   . Attends Archivist Meetings:   Marland Kitchen Marital Status:    No Known Allergies Family History  Problem Relation Age of Onset  . Hypertension Maternal Grandmother   . Cancer Maternal Grandfather   . Hypertension Maternal Grandfather   . Hypertension Paternal Grandmother   . Cancer Paternal Grandfather   . Hypertension Paternal Grandfather        Current Outpatient Medications (Analgesics):  .  meloxicam (MOBIC) 15 MG tablet, Take 1 tablet (15 mg total) by mouth daily.   Current Outpatient Medications (  Other):  .  tiZANidine (ZANAFLEX) 4 MG tablet, Take 1 tablet (4 mg total) by mouth Nightly for 10 days.   Reviewed prior external information including notes and imaging from  primary care provider As well as notes that were available from care everywhere and other healthcare systems.  Past medical history, social, surgical and family history all reviewed in electronic medical record.  No pertanent information unless stated regarding to the chief complaint.   Review of Systems:  No headache, visual changes, nausea, vomiting, diarrhea, constipation, dizziness, abdominal pain, skin rash, fevers, chills, night sweats, weight  loss, swollen lymph nodes, body aches, joint swelling, chest pain, shortness of breath, mood changes. POSITIVE muscle aches  Objective  Blood pressure 110/64, pulse 85, height 6\' 1"  (1.854 m), weight 254 lb 9.6 oz (115.5 kg), SpO2 98 %.   General: No apparent distress alert and oriented x3 mood and affect normal, dressed appropriately.  HEENT: Pupils equal, extraocular movements intact  Respiratory: Patient's speak in full sentences and does not appear short of breath  Cardiovascular: No lower extremity edema, non tender, no erythema  Neuro: Cranial nerves II through XII are intact, neurovascularly intact in all extremities with 2+ DTRs and 2+ pulses.  Gait normal with good balance and coordination.  MSK:  Non tender with full range of motion and good stability and symmetric strength and tone of shoulders, elbows, wrist, hip, knee and ankles bilaterally.  Low back exam shows the patient does have tenderness to palpation of the paraspinal musculature lumbar spine right greater than left.  Worsening pain with extension and flexion.  Negative straight leg test.  Mild positive .  Pain seems to be more localized around the right sacroiliac joints as well as the right gluteal area.  Osteopathic findings  C2 flexed rotated and side bent right C6 flexed rotated and side bent left T3 extended rotated and side bent right inhaled third rib T6 extended rotated and side bent left L2 flexed rotated and side bent right Sacrum right on right    Impression and Recommendations:     This case required medical decision making of moderate complexity. The above documentation has been reviewed and is accurate and complete Pearlean Brownie, DO       Note: This dictation was prepared with Dragon dictation along with smaller phrase technology. Any transcriptional errors that result from this process are unintentional.

## 2020-02-22 ENCOUNTER — Encounter: Payer: Self-pay | Admitting: Family Medicine

## 2020-02-22 DIAGNOSIS — M999 Biomechanical lesion, unspecified: Secondary | ICD-10-CM | POA: Insufficient documentation

## 2020-02-22 NOTE — Assessment & Plan Note (Signed)

## 2020-03-18 ENCOUNTER — Ambulatory Visit: Payer: BLUE CROSS/BLUE SHIELD | Admitting: Family Medicine

## 2020-04-03 ENCOUNTER — Encounter: Payer: Self-pay | Admitting: Family Medicine

## 2020-04-03 ENCOUNTER — Ambulatory Visit (INDEPENDENT_AMBULATORY_CARE_PROVIDER_SITE_OTHER): Payer: BC Managed Care – PPO | Admitting: Family Medicine

## 2020-04-03 ENCOUNTER — Other Ambulatory Visit: Payer: Self-pay

## 2020-04-03 DIAGNOSIS — M7061 Trochanteric bursitis, right hip: Secondary | ICD-10-CM | POA: Diagnosis not present

## 2020-04-03 DIAGNOSIS — M7071 Other bursitis of hip, right hip: Secondary | ICD-10-CM | POA: Insufficient documentation

## 2020-04-03 MED ORDER — MELOXICAM 15 MG PO TABS: 15.0000 mg | ORAL_TABLET | Freq: Every day | ORAL | 0 refills | Status: DC

## 2020-04-03 NOTE — Progress Notes (Signed)
[facility] [address] [place] Phone: [phone] Subjective:   I Brian Carlson am serving as a Education administrator for Dr. Hulan Saas.  This visit occurred during the SARS-CoV-2 public health emergency.  Safety protocols were in place, including screening questions prior to the visit, additional usage of staff PPE, and extensive cleaning of exam room while observing appropriate contact time as indicated for disinfecting solutions.   I'm seeing this patient by the request  of:  System, Pcp Not In  CC: Back pain follow-up  [id number]  Brian Carlson is a 34 y.o. male coming in with complaint of back pain. Last seen on 02/20/2020 for OMT. Patient states he is doing well. Still having some hip pain.  Patient states that the manipulation helps the lower back significantly.  Pain is only in the lateral aspect of the hip.  Only seems to give him trouble after being extremely active.  Never seems to be anything that stops him from activity.  Very stiff in the mornings.  Patient has changed beds and has done a lot of other different changes but continues to have the same discomfort.  Patient is able to play golf on a regular basis with minimal discomfort.       Past Medical History:  Diagnosis Date  . Chicken pox   . COVID-19 09/2019   Past Surgical History:  Procedure Laterality Date  . NASAL SINUS SURGERY  2002   Social History   Socioeconomic History  . Marital status: Married    Spouse name: Not on file  . Number of children: Not on file  . Years of education: Not on file  . Highest education level: Not on file  Occupational History  . Not on file  Tobacco Use  . Smoking status: Never Smoker  . Smokeless tobacco: Never Used  Substance and Sexual Activity  . Alcohol use: Yes    Alcohol/week: 0.0 standard drinks  . Drug use: No  . Sexual activity: Not on file  Other Topics Concern  . Not on file  Social History Narrative   Married   One daughter   Software support business owner   Social Determinants of Health   Financial Resource Strain:   . Difficulty of Paying Living Expenses:   Food Insecurity:   . Worried About Charity fundraiser in the Last Year:   . Arboriculturist in the Last Year:   Transportation Needs:   . Film/video editor (Medical):   Marland Kitchen Lack of Transportation (Non-Medical):   Physical Activity:   . Days of Exercise per Week:   . Minutes of Exercise per Session:   Stress:   . Feeling of Stress :   Social Connections:   . Frequency of Communication with Friends and Family:   . Frequency of Social Gatherings with Friends and Family:   . Attends Religious Services:   . Active Member of Clubs or Organizations:   . Attends Archivist Meetings:   Marland Kitchen Marital Status:    No Known Allergies Family History  Problem Relation Age of Onset  . Hypertension Maternal Grandmother   . Cancer Maternal Grandfather   . Hypertension Maternal Grandfather   . Hypertension Paternal Grandmother   . Cancer Paternal Grandfather   . Hypertension Paternal Grandfather        Current Outpatient Medications (Analgesics):  .  meloxicam (MOBIC) 15 MG tablet, Take 1 tablet (15 mg total) by mouth  daily. .  meloxicam (MOBIC) 15 MG tablet, Take 1 tablet (15 mg total) by mouth daily.     Reviewed prior external information including notes and imaging from  primary care provider As well as notes that were available from care everywhere and other healthcare systems.  Past medical history, social, surgical and family history all reviewed in electronic medical record.  No pertanent information unless stated regarding to the chief complaint.   Review of Systems:  No headache, visual changes, nausea, vomiting, diarrhea, constipation, dizziness, abdominal pain, skin rash, fevers, chills, night sweats, weight loss, swollen lymph nodes, body aches, joint swelling, chest pain, shortness of breath, mood  changes. POSITIVE muscle aches  Objective  Blood pressure 100/70, pulse 68, height 6\' 1"  (1.854 m), weight 248 lb (112.5 kg), SpO2 98 %.   General: No apparent distress alert and oriented x3 mood and affect normal, dressed appropriately.  HEENT: Pupils equal, extraocular movements intact  Respiratory: Patient's speak in full sentences and does not appear short of breath  Cardiovascular: No lower extremity edema, non tender, no erythema  Neuro: Cranial nerves II through XII are intact, neurovascularly intact in all extremities with 2+ DTRs and 2+ pulses.  Gait normal with good balance and coordination.  MSK:  Non tender with full range of motion and good stability and symmetric strength and tone of shoulders, elbows, wrist,  knee and ankles bilaterally.  Right hip exam seems to be more tender over the lateral aspect of the hip and gluteal area.  Mild positive pain with .  Patient does have pain though with resisted abduction of the hip but near full range of motion and near full strength compared to contralateral side.  Deep tendon reflexes intact.  Back is significantly improved   Impression and Recommendations:     This case required medical decision making of moderate complexity. The above documentation has been reviewed and is accurate and complete Pearlean Brownie, DO       Note: This dictation was prepared with Dragon dictation along with smaller phrase technology. Any transcriptional errors that result from this process are unintentional.

## 2020-04-03 NOTE — Patient Instructions (Addendum)
Likely bursitis or tendonitis Meloxicam 15 mg daily for a month Hip abductor strengthening  See me again in 5-6 weeks if no better I get to poke you with a needle

## 2020-04-03 NOTE — Assessment & Plan Note (Signed)
Patient has more of a bursitis.  Discussed icing regimen and home exercise, which activities to do which wants to avoid.  Discussed icing regimen and home exercises.  Patient wants to try meloxicam and prescribed today.  Worsening symptoms will consider injection.  No need for manipulation today

## 2020-05-15 ENCOUNTER — Ambulatory Visit: Payer: BC Managed Care – PPO | Admitting: Family Medicine

## 2020-06-18 ENCOUNTER — Ambulatory Visit: Payer: BC Managed Care – PPO | Admitting: Family Medicine

## 2020-06-18 NOTE — Progress Notes (Deleted)
°  [name] [address] [place] [address] Phone: [phone] Subjective:    I'm seeing this patient by the request  of:  System, Pcp Not In  CC:   IDP:[id number]   04/03/2020 Patient has more of a bursitis.  Discussed icing regimen and home exercise, which activities to do which wants to avoid.  Discussed icing regimen and home exercises.  Patient wants to try meloxicam and prescribed today.  Worsening symptoms will consider injection.  No need for manipulation today  Update 06/18/2020 Brian Carlson is a 34 y.o. male coming in with complaint of back and neck pain. Hip GT pain. Patient states   Medications patient has been prescribed:   Taking:         Reviewed prior external information including notes and imaging from previsou exam, outside providers and external EMR if available.   As well as notes that were available from care everywhere and other healthcare systems.  Past medical history, social, surgical and family history all reviewed in electronic medical record.  No pertanent information unless stated regarding to the chief complaint.   Past Medical History:  Diagnosis Date   Chicken pox    COVID-19 09/2019    No Known Allergies   Review of Systems:  No headache, visual changes, nausea, vomiting, diarrhea, constipation, dizziness, abdominal pain, skin rash, fevers, chills, night sweats, weight loss, swollen lymph nodes, body aches, joint swelling, chest pain, shortness of breath, mood changes. POSITIVE muscle aches  Objective  There were no vitals taken for this visit.   General: No apparent distress alert and oriented x3 mood and affect normal, dressed appropriately.  HEENT: Pupils equal, extraocular movements intact  Respiratory: Patient's speak in full sentences and does not appear short of breath  Cardiovascular: No lower extremity edema, non tender, no erythema  Neuro: Cranial nerves II through XII are intact,  neurovascularly intact in all extremities with 2+ DTRs and 2+ pulses.  Gait normal with good balance and coordination.  MSK:  Non tender with full range of motion and good stability and symmetric strength and tone of shoulders, elbows, wrist, hip, knee and ankles bilaterally.  Back - Normal skin, Spine with normal alignment and no deformity.  No tenderness to vertebral process palpation.  Paraspinous muscles are not tender and without spasm.   Range of motion is full at neck and lumbar sacral regions  Osteopathic findings  C2 flexed rotated and side bent right C6 flexed rotated and side bent left T3 extended rotated and side bent right inhaled rib T9 extended rotated and side bent left L2 flexed rotated and side bent right Sacrum right on right       Assessment and Plan:    Nonallopathic problems  Decision today to treat with OMT was based on Physical Exam  After verbal consent patient was treated with HVLA, ME, FPR techniques in cervical, rib, thoracic, lumbar, and sacral  areas  Patient tolerated the procedure well with improvement in symptoms  Patient given exercises, stretches and lifestyle modifications  See medications in patient instructions if given  Patient will follow up in 4-8 weeks      The above documentation has been reviewed and is accurate and complete Judi Saa, DO       Note: This dictation was prepared with Dragon dictation along with smaller phrase technology. Any transcriptional errors that result from this process are unintentional.

## 2020-10-07 ENCOUNTER — Telehealth: Payer: Self-pay | Admitting: Unknown Physician Specialty

## 2020-10-07 ENCOUNTER — Telehealth (HOSPITAL_COMMUNITY): Payer: Self-pay | Admitting: *Deleted

## 2020-10-07 ENCOUNTER — Other Ambulatory Visit: Payer: Self-pay | Admitting: Unknown Physician Specialty

## 2020-10-07 DIAGNOSIS — E663 Overweight: Secondary | ICD-10-CM

## 2020-10-07 DIAGNOSIS — U071 COVID-19: Secondary | ICD-10-CM

## 2020-10-07 NOTE — Telephone Encounter (Signed)
I connected by phone with Brian Carlson on 10/07/2020 at 4:34 PM to discuss the potential use of a new treatment for mild to moderate COVID-19 viral infection in non-hospitalized patients.  This patient is a 34 y.o. male that meets the FDA criteria for Emergency Use Authorization of COVID monoclonal antibody casirivimab/imdevimab, bamlanivimab/eteseviamb, or sotrovimab.  Has a (+) direct SARS-CoV-2 viral test result  Has mild or moderate COVID-19   Is NOT hospitalized due to COVID-19  Is within 10 days of symptom onset  Has at least one of the high risk factor(s) for progression to severe COVID-19 and/or hospitalization as defined in EUA.  Specific high risk criteria : BMI > 25   I have spoken and communicated the following to the patient or parent/caregiver regarding COVID monoclonal antibody treatment:  1. FDA has authorized the emergency use for the treatment of mild to moderate COVID-19 in adults and pediatric patients with positive results of direct SARS-CoV-2 viral testing who are 63 years of age and older weighing at least 40 kg, and who are at high risk for progressing to severe COVID-19 and/or hospitalization.  2. The significant known and potential risks and benefits of COVID monoclonal antibody, and the extent to which such potential risks and benefits are unknown.  3. Information on available alternative treatments and the risks and benefits of those alternatives, including clinical trials.  4. Patients treated with COVID monoclonal antibody should continue to self-isolate and use infection control measures (e.g., wear mask, isolate, social distance, avoid sharing personal items, clean and disinfect "high touch" surfaces, and frequent handwashing) according to CDC guidelines.   5. The patient or parent/caregiver has the option to accept or refuse COVID monoclonal antibody treatment.  After reviewing this information with the patient, the patient has agreed to receive one  of the available covid 19 monoclonal antibodies and will be provided an appropriate fact sheet prior to infusion. [name], NP 10/07/2020 4:34 PM  Sx onset 11/14

## 2020-10-07 NOTE — Telephone Encounter (Signed)
Called to Discuss with patient about Covid symptoms and the use of the monoclonal antibody infusion for those with mild to moderate Covid symptoms and at a high risk of hospitalization.     Pt appears to qualify for this infusion due to co-morbid conditions and/or a member of an at-risk group in accordance with the FDA Emergency Use Authorization.    Pt stated symptoms started on 11/15, tested positive 11/16 at [facility]. Symptoms include fever and cough. Qualifying risk factor includes BMI>25. Pt stated height 6'1" and weight 230lbs.   Pt informed an APP will be following up to verify information and if he qualifies will schedule an appointment.   Discussed estimated charges with pt and he will call insurance to see if treatment is covered.

## 2020-10-08 ENCOUNTER — Ambulatory Visit (HOSPITAL_COMMUNITY)
Admission: RE | Admit: 2020-10-08 | Discharge: 2020-10-08 | Disposition: A | Discharge status: Home / Self Care | Payer: BC Managed Care – PPO | Source: Ambulatory Visit | Attending: Pulmonary Disease | Admitting: Pulmonary Disease

## 2020-10-08 DIAGNOSIS — U071 COVID-19: Secondary | ICD-10-CM | POA: Insufficient documentation

## 2020-10-08 DIAGNOSIS — E663 Overweight: Secondary | ICD-10-CM

## 2020-10-08 MED ORDER — SODIUM CHLORIDE 0.9 % IV SOLN: INTRAVENOUS | Status: DC | PRN

## 2020-10-08 MED ORDER — FAMOTIDINE IN NACL 20-0.9 MG/50ML-% IV SOLN: 20.0000 mg | Freq: Once | INTRAVENOUS | Status: DC | PRN

## 2020-10-08 MED ORDER — METHYLPREDNISOLONE SODIUM SUCC 125 MG IJ SOLR: 125.0000 mg | Freq: Once | INTRAMUSCULAR | Status: DC | PRN

## 2020-10-08 MED ORDER — SOTROVIMAB 500 MG/8ML IV SOLN
500.0000 mg | Freq: Once | INTRAVENOUS | Status: AC
  Administered 2020-10-08: 500 mg via INTRAVENOUS

## 2020-10-08 MED ORDER — EPINEPHRINE 0.3 MG/0.3ML IJ SOAJ: 0.3000 mg | Freq: Once | INTRAMUSCULAR | Status: DC | PRN

## 2020-10-08 MED ORDER — ALBUTEROL SULFATE HFA 108 (90 BASE) MCG/ACT IN AERS: 2.0000 | INHALATION_SPRAY | Freq: Once | RESPIRATORY_TRACT | Status: DC | PRN

## 2020-10-08 MED ORDER — DIPHENHYDRAMINE HCL 50 MG/ML IJ SOLN: 50.0000 mg | Freq: Once | INTRAMUSCULAR | Status: DC | PRN

## 2020-10-08 NOTE — Progress Notes (Signed)
Diagnosis: COVID-19  Physician: Dr. Shan Levans  Procedure: Covid Infusion Clinic Med: Sotrovimab infusion - Provided patient with sotrovimab fact sheet for patients, parents, and caregivers prior to infusion. Cost of infusion was discussed with patient. Pateint agrees to continue with infusion.  Complications: No immediate complications noted  Discharge: Discharged home  If after the infusion you have any questions or concerns please call the Advanced Practice Provider at [phone]   Brian Carlson 10/08/2020

## 2020-10-08 NOTE — Discharge Instructions (Signed)

## 2021-02-07 IMAGING — DX DG LUMBAR SPINE COMPLETE 4+V
5 series · 5 of 5 positions shown · non-contrast
Comparison: None.

CLINICAL DATA: Back pain, lifting injury, golfing injury

EXAM:
LUMBAR SPINE - COMPLETE 4+ VIEW

[lumbar spine ap]
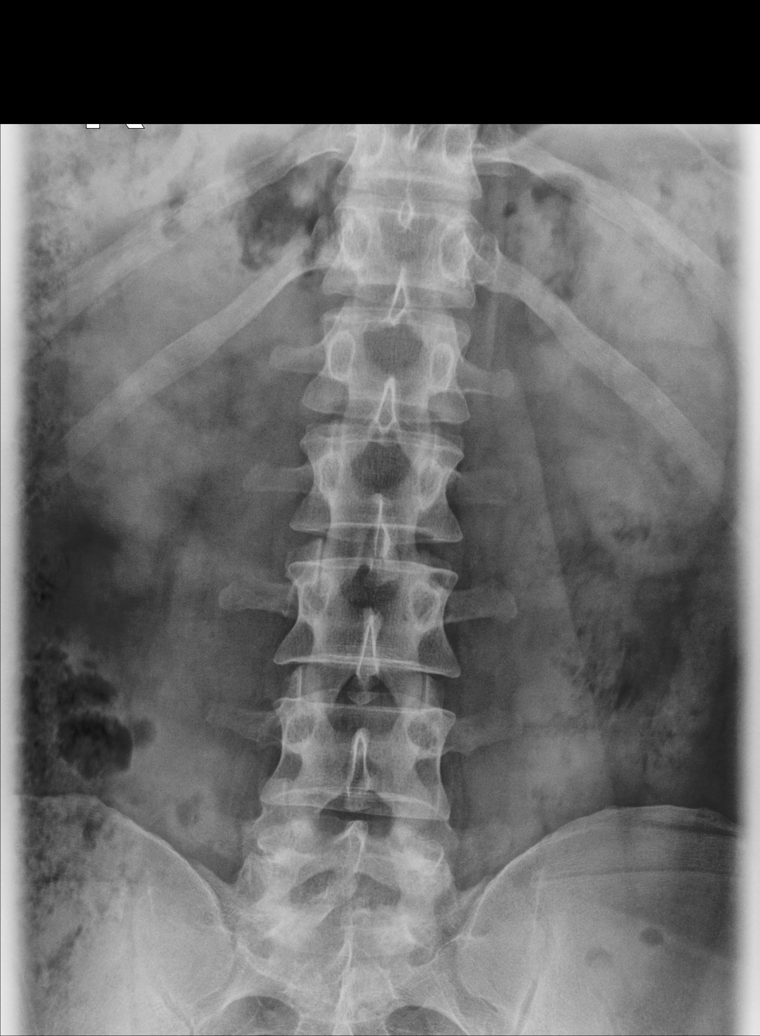

[lumbar spine mlo (1 of 2)]
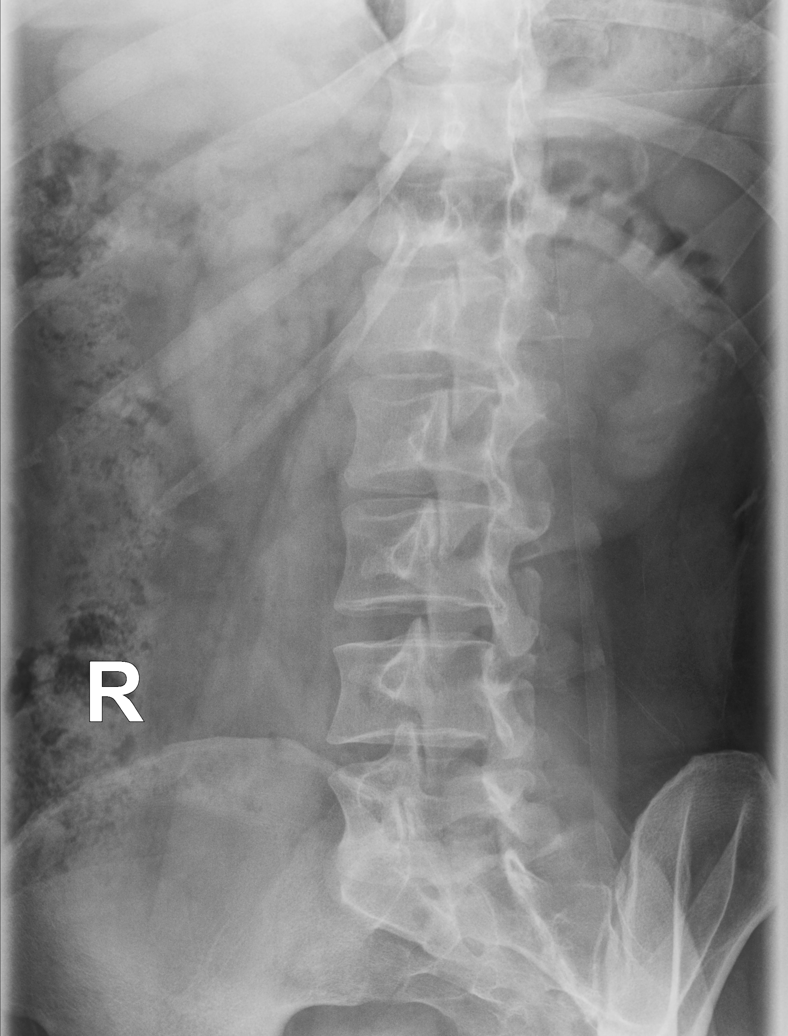

[lumbar spine mlo (2 of 2)]
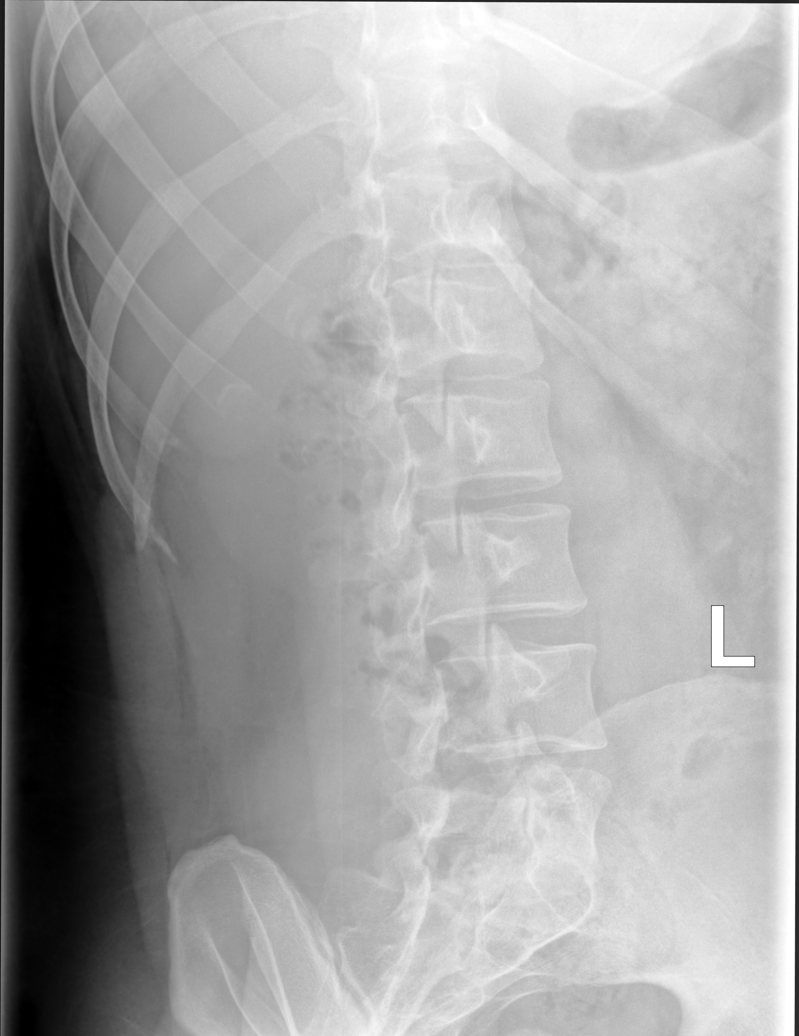

[lumbar spine lat (1 of 2)]
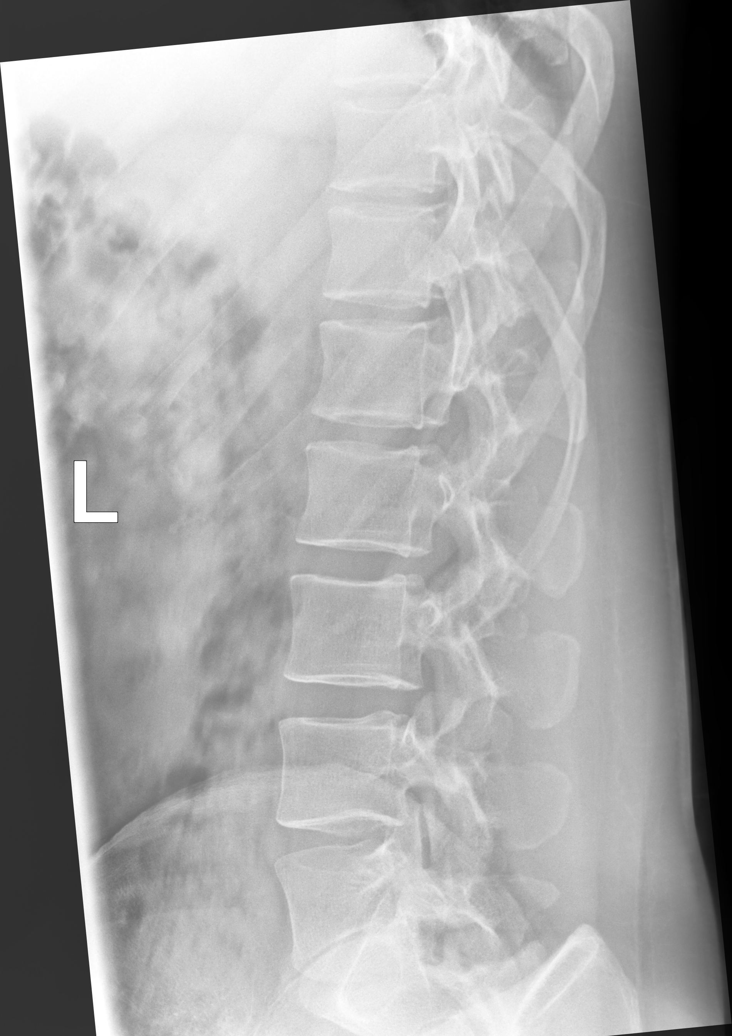

[lumbar spine lat (2 of 2)]
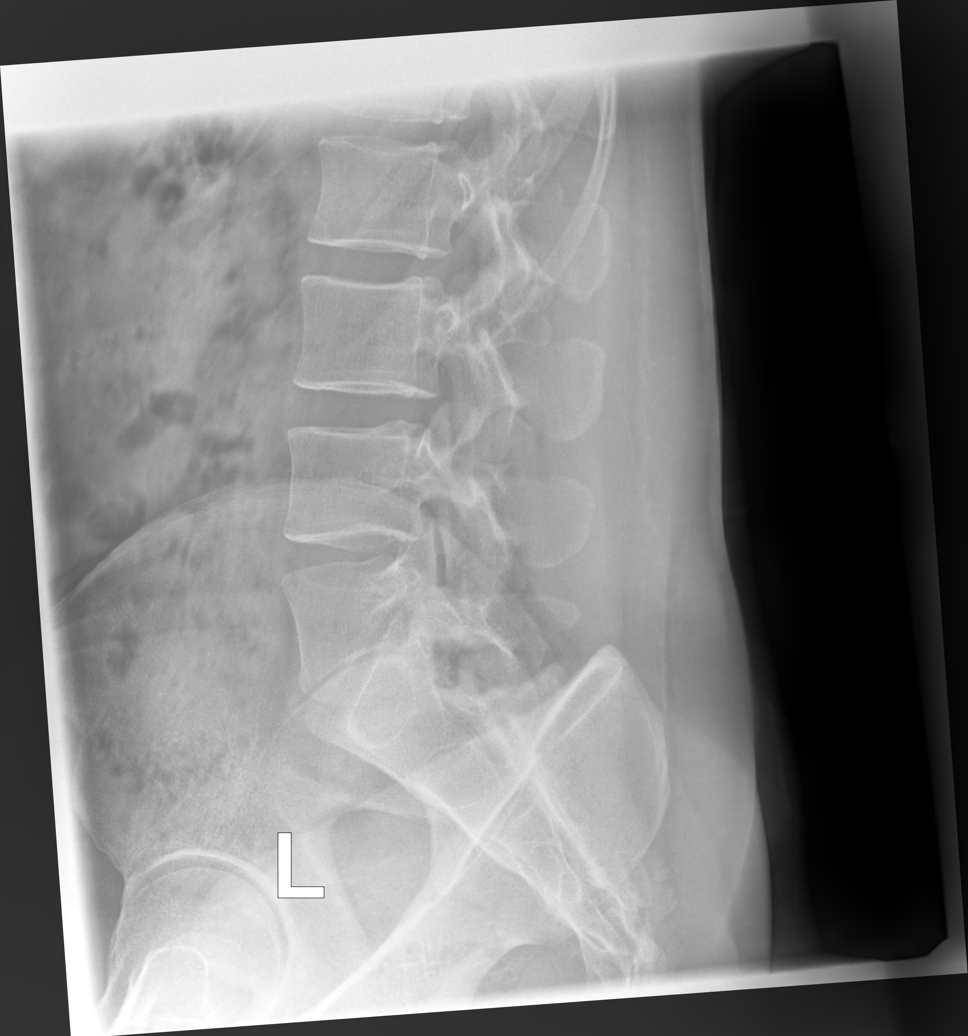

[5 of 5 positions shown; findings below may reference images not displayed]

FINDINGS: Frontal, bilateral oblique, and lateral views of the lumbar spine
are obtained. There are 5 non-rib-bearing lumbar type vertebral
bodies in grossly normal anatomic alignment. The L5/S1 disc is
hypoplastic. Remaining disc spaces are well preserved. No
significant facet hypertrophy. Sacroiliac joints are normal.
IMPRESSION: 1. Unremarkable lumbar spine.

## 2022-03-27 ENCOUNTER — Telehealth: Payer: BC Managed Care – PPO | Admitting: Nurse Practitioner

## 2022-03-27 ENCOUNTER — Encounter: Payer: Self-pay | Admitting: Nurse Practitioner

## 2022-03-27 DIAGNOSIS — B9689 Other specified bacterial agents as the cause of diseases classified elsewhere: Secondary | ICD-10-CM

## 2022-03-27 DIAGNOSIS — J329 Chronic sinusitis, unspecified: Secondary | ICD-10-CM

## 2022-03-27 MED ORDER — AMOXICILLIN-POT CLAVULANATE 875-125 MG PO TABS: 1.0000 | ORAL_TABLET | Freq: Two times a day (BID) | ORAL | 0 refills | Status: AC

## 2022-03-27 NOTE — Progress Notes (Signed)
?Virtual Visit Consent  ? ?Brian Carlson, you are scheduled for a virtual visit with a Brian Carlson provider today. Just as with appointments in the office, your consent must be obtained to participate. Your consent will be active for this visit and any virtual visit you may have with one of our providers in the next 365 days. If you have a MyChart account, a copy of this consent can be sent to you electronically. ? ?As this is a virtual visit, video technology does not allow for your provider to perform a traditional examination. This may limit your provider's ability to fully assess your condition. If your provider identifies any concerns that need to be evaluated in person or the need to arrange testing (such as labs, EKG, etc.), we will make arrangements to do so. Although advances in technology are sophisticated, we cannot ensure that it will always work on either your end or our end. If the connection with a video visit is poor, the visit may have to be switched to a telephone visit. With either a video or telephone visit, we are not always able to ensure that we have a secure connection. ? ?By engaging in this virtual visit, you consent to the provision of healthcare and authorize for your insurance to be billed (if applicable) for the services provided during this visit. Depending on your insurance coverage, you may receive a charge related to this service. ? ?I need to obtain your verbal consent now. Are you willing to proceed with your visit today? Brian Carlson has provided verbal consent on 03/27/2022 for a virtual visit (video or telephone). Brian Pounds, NP ? ?Date: 03/27/2022 5:18 PM ? ?Virtual Visit via Video Note  ? ?Brian Carlson, connected with  Brian Carlson  ([id number], 1985/12/13) on 03/27/22 at  9:45 AM EDT by a video-enabled telemedicine application and verified that I am speaking with the correct person using two identifiers. ? ?Location: ?Patient: Virtual Visit Location Patient:  Home ?Provider: Virtual Visit Location Provider: Home Office ?  ?I discussed the limitations of evaluation and management by telemedicine and the availability of in person appointments. The patient expressed understanding and agreed to proceed.   ? ?History of Present Illness: ?Brian Carlson is a 36 y.o. who identifies as a male who was assigned male at birth, and is being seen today for bacterial sinusitis ? ?Sinus Pain: Patient complains of congestion, cough described as productive of yellow and green sputum, facial pain, headache described as mild, nasal congestion, purulent nasal discharge, and sinus pressure. Onset of symptoms was 2 weeks ago, gradually worsening since that time. Past history is significant for  nasal turbinate surgery . Patient is non-smoker . ? ?1.5 weeks. Initially thought was allergies. Nasal congestion. Losing voice/hoarseness, mild headache. Green and yellow nasal discharge. Productive cough. Had nasal turbinate surgery when he was younger.  ?Zyrtec twice a day, flonase. With no improvement. Brian Carlson [address]  ? ?Problems:  ?Patient Active Problem List  ? Diagnosis Date Noted  ? Bursitis of right hip 04/03/2020  ? Nonallopathic lesion of sacral region 02/22/2020  ? Nonallopathic lesion of lumbosacral region 02/22/2020  ? 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 Thoracic outlet syndrome 05/11/2015  ? Nonallopathic lesion of thoracic region 05/11/2015  ? Nonallopathic lesion of cervical region 05/11/2015  ? Nonallopathic lesion-rib cage 05/11/2015  ? C6 radiculopathy 04/23/2015  ?  ?Allergies: No Known Allergies ?Medications:  ?Current Outpatient Medications:  ?  amoxicillin-clavulanate (AUGMENTIN) 875-125 MG tablet, Take 1 tablet by mouth 2 (two) times daily for 7 days., Disp: 14 tablet, Rfl: 0 ?  meloxicam (MOBIC) 15 MG tablet, Take 1 tablet (15 mg total) by mouth daily., Disp: 90 tablet, Rfl: 0 ? ?Observations/Objective: ?Patient is well-developed, well-nourished in no  acute distress.  ?Resting comfortably at home.  ?Head is normocephalic, atraumatic.  ?No labored breathing.  ?Speech is clear and coherent with logical content.  ?Patient is alert and oriented at baseline.  ? ? ?Assessment and Plan: ?1. Bacterial sinusitis ?- amoxicillin-clavulanate (AUGMENTIN) 875-125 MG tablet; Take 1 tablet by mouth 2 (two) times daily for 7 days.  Dispense: 14 tablet; Refill: 0 ? ?INSTRUCTIONS: use a humidifier for nasal congestion ?Drink plenty of fluids, rest and wash hands frequently to avoid the spread of infection ?Alternate tylenol and Motrin for relief of fever  ? ?Follow Up Instructions: ?I discussed the assessment and treatment plan with the patient. The patient was provided an opportunity to ask questions and all were answered. The patient agreed with the plan and demonstrated an understanding of the instructions.  A copy of instructions were sent to the patient via MyChart unless otherwise noted below.  ? ?The patient was advised to call back or seek an in-person evaluation if the symptoms worsen or if the condition fails to improve as anticipated. ? ?Time:  ?I spent 11 minutes with the patient via telehealth technology discussing the above problems/concerns.   ? ?Brian Pounds, NP  ?

## 2022-03-27 NOTE — Patient Instructions (Signed)
?  Brian Carlson, thank you for joining Claiborne Rigg, NP for today's virtual visit.  While this provider is not your primary care provider (PCP), if your PCP is located in our provider database this encounter information will be shared with them immediately following your visit. ? ?Consent: ?(Patient) Brian Carlson provided verbal consent for this virtual visit at the beginning of the encounter. ? ?Current Medications: ? ?Current Outpatient Medications:  ?  amoxicillin-clavulanate (AUGMENTIN) 875-125 MG tablet, Take 1 tablet by mouth 2 (two) times daily for 7 days., Disp: 14 tablet, Rfl: 0 ?  meloxicam (MOBIC) 15 MG tablet, Take 1 tablet (15 mg total) by mouth daily., Disp: 90 tablet, Rfl: 0  ? ?Medications ordered in this encounter:  ?Meds ordered this encounter  ?Medications  ? amoxicillin-clavulanate (AUGMENTIN) 875-125 MG tablet  ?  Sig: Take 1 tablet by mouth 2 (two) times daily for 7 days.  ?  Dispense:  14 tablet  ?  Refill:  0  ?  Order Specific Question:   Supervising Provider  ?  Answer:   Eber Hong [[id number]]  ?  ? ?*If you need refills on other medications prior to your next appointment, please contact your pharmacy* ? ?Follow-Up: ?Call back or seek an in-person evaluation if the symptoms worsen or if the condition fails to improve as anticipated. ? ?Other Instructions ?INSTRUCTIONS: use a humidifier for nasal congestion ?Drink plenty of fluids, rest and wash hands frequently to avoid the spread of infection ?Alternate tylenol and Motrin for relief of fever  ? ? ?If you have been instructed to have an in-person evaluation today at a local Urgent Care facility, please use the link below. It will take you to a list of all of our available [facility], including address, phone number and hours of operation. Please do not delay care.  ?[facility] ? ?If you or a family member do not have a primary care provider, use the link below to schedule a visit and establish care. When  you choose a [facility] primary care physician or advanced practice provider, you gain a long-term partner in health. ?Find a Primary Care Provider ? ?Learn more about [facility] in-office and virtual care options: ?[facility] Get Care Now  ?

## 2022-07-05 ENCOUNTER — Ambulatory Visit (INDEPENDENT_AMBULATORY_CARE_PROVIDER_SITE_OTHER): Payer: BC Managed Care – PPO | Admitting: Medical

## 2022-07-05 VITALS — BP 126/82 | HR 66 | Temp 98.0°F | Resp 18 | Ht 73.0 in | Wt 240.0 lb

## 2022-07-05 DIAGNOSIS — Z0000 Encounter for general adult medical examination without abnormal findings: Secondary | ICD-10-CM

## 2022-07-05 LAB — COMPREHENSIVE METABOLIC PANEL
ALT: 32 U/L (ref 0–53)
AST: 23 U/L (ref 0–37)
Albumin: 4.6 g/dL (ref 3.5–5.2)
Alkaline Phosphatase: 62 U/L (ref 39–117)
BUN: 17 mg/dL (ref 6–23)
CO2: 27 mEq/L (ref 19–32)
Calcium: 10 mg/dL (ref 8.4–10.5)
Chloride: 101 mEq/L (ref 96–112)
Creatinine, Ser: 0.89 mg/dL (ref 0.40–1.50)
GFR: 109.5 mL/min (ref >60.00)
Glucose, Bld: 90 mg/dL (ref 70–99)
Potassium: 4.2 mEq/L (ref 3.5–5.1)
Sodium: 138 mEq/L (ref 135–145)
Total Bilirubin: 0.6 mg/dL (ref 0.2–1.2)
Total Protein: 7.1 g/dL (ref 6.0–8.3)

## 2022-07-05 LAB — CBC WITH DIFFERENTIAL/PLATELET
Basophils Absolute: 0 10*3/uL (ref 0.0–0.1)
Basophils Relative: 0.5 % (ref 0.0–3.0)
Eosinophils Absolute: 0.2 10*3/uL (ref 0.0–0.7)
Eosinophils Relative: 3.8 % (ref 0.0–5.0)
HCT: 46.4 % (ref 39.0–52.0)
Hemoglobin: 15.4 g/dL (ref 13.0–17.0)
Lymphocytes Relative: 33.6 % (ref 12.0–46.0)
Lymphs Abs: 1.3 10*3/uL (ref 0.7–4.0)
MCHC: 33.2 g/dL (ref 30.0–36.0)
MCV: 86 fl (ref 78.0–100.0)
Monocytes Absolute: 0.4 10*3/uL (ref 0.1–1.0)
Monocytes Relative: 10.3 % (ref 3.0–12.0)
Neutro Abs: 2.1 10*3/uL (ref 1.4–7.7)
Neutrophils Relative %: 51.8 % (ref 43.0–77.0)
Platelets: 169 10*3/uL (ref 150.0–400.0)
RBC: 5.39 Mil/uL (ref 4.22–5.81)
RDW: 13.1 % (ref 11.5–15.5)
WBC: 4 10*3/uL (ref 4.0–10.5)

## 2022-07-05 LAB — LIPID PANEL
Cholesterol: 199 mg/dL (ref 0–200)
HDL: 50.1 mg/dL (ref >39.00)
LDL Cholesterol: 128 mg/dL — ABNORMAL HIGH (ref 0–99)
NonHDL: 148.55
Total CHOL/HDL Ratio: 4
Triglycerides: 104 mg/dL (ref 0.0–149.0)
VLDL: 20.8 mg/dL (ref 0.0–40.0)

## 2022-07-05 NOTE — Addendum Note (Signed)
Addended by: Gwenevere Abbot on: 07/05/2022 12:52 PM   Modules accepted: Orders

## 2022-07-05 NOTE — Progress Notes (Signed)
Subjective:    Patient ID: Brian Carlson, male    DOB: Mar 31, 1986, 36 y.o.   MRN: [id number]  HPI  Pt in for first time. Will go ahead and do wellness exam.   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  Eats moderate healthy. 1-2 beers a week at most. Nonsmoker. Married and 2 children.  Up to date on tetanus.     Review of Systems  Constitutional:  Negative for chills, fatigue and fever.  HENT:  Negative for congestion and drooling.   Respiratory:  Negative for chest tightness and wheezing.   Cardiovascular:  Negative for chest pain and palpitations.  Gastrointestinal:  Negative for abdominal pain.  Genitourinary:  Negative for dysuria, flank pain and frequency.  Musculoskeletal:  Negative for back pain and myalgias.  Neurological:  Negative for dizziness, weakness, numbness and headaches.  Hematological:  Negative for adenopathy. Does not bruise/bleed easily.  Psychiatric/Behavioral:  Negative for behavioral problems and confusion.     Past Medical History:  Diagnosis Date   Chicken pox    COVID-19 09/2019     Social History   Socioeconomic History   Marital status: Married    Spouse name: Not on file   Number of children: Not on file   Years of education: Not on file   Highest education level: Not on file  Occupational History   Not on file  Tobacco Use   Smoking status: Never   Smokeless tobacco: Never  Substance and Sexual Activity   Alcohol use: Yes    Alcohol/week: 0.0 standard drinks of alcohol   Drug use: No   Sexual activity: Not on file  Other Topics Concern   Not on file  Social History Narrative   Married   One daughter   Software support business owner   Social Determinants of Health   Financial Resource Strain: Not on file  Food Insecurity: Not on file  Transportation Needs: Not on file  Physical Activity: Not on file  Stress: Not on file  Social Connections: Not on file  Intimate Partner Violence: Not on file     Past Surgical History:  Procedure Laterality Date   NASAL SINUS SURGERY  2002    Family History  Problem Relation Age of Onset   Hypertension Maternal Grandmother    Cancer Maternal Grandfather    Hypertension Maternal Grandfather    Hypertension Paternal Grandmother    Cancer Paternal Grandfather    Hypertension Paternal Grandfather     Allergies  Allergen Reactions   Pertussis Vaccines Other (See Comments)    unknown    No current outpatient medications on file prior to visit.   No current facility-administered medications on file prior to visit.    BP 126/82   Pulse 66   Temp 98 F (36.7 C)   Resp 18   Ht 6\' 1"  (1.854 m)   Wt 240 lb (108.9 kg)   SpO2 99%   BMI 31.66 kg/m        Objective:   Physical Exam  General Mental Status- Alert. General Appearance- Not in acute distress.   Skin General: Color- Normal Color. Moisture- Normal Moisture.  Neck Carotid Arteries- Normal color. Moisture- Normal Moisture. No carotid bruits. No JVD.  Chest and Lung Exam Auscultation: Breath Sounds:-Normal.  Cardiovascular Auscultation:Rythm- Regular. Murmurs & Other Heart Sounds:Auscultation of the heart reveals- No Murmurs.  Abdomen Inspection:-Inspeection Normal. Palpation/Percussion:Note:No mass. Palpation and Percussion of the abdomen reveal- Non  Tender, Non Distended + BS, no rebound or guarding.   Neurologic Cranial Nerve exam:- CN Carlson-XII intact(No nystagmus), symmetric smile. Strength:- 5/5 equal and symmetric strength both upper and lower extremities.       Assessment & Plan:   Patient Instructions  For you wellness exam today I have ordered cbc, cmp and lipid panel.  For desired wt loos might consider [employer] app in combination with exercise as you already started.  Vaccine appear up to date on review.  Recommend exercise and healthy diet.  We will let you know lab results as they come in.  Follow up date appointment will be determined  after lab review.        Esperanza Richters, PA-C

## 2022-07-05 NOTE — Patient Instructions (Addendum)
For you wellness exam today I have ordered cbc, cmp and lipid panel.  For desired wt loose might consider [employer] app in combination with exercise as you already started.  Vaccine appear up to date on review.  Recommend exercise and healthy diet.  We will let you know lab results as they come in.  Follow up date appointment will be determined after lab review.     Preventive Care 24-37 Years Old, Male Preventive care refers to lifestyle choices and visits with your health care provider that can promote health and wellness. Preventive care visits are also called wellness exams. What can I expect for my preventive care visit? Counseling During your preventive care visit, your health care provider may ask about your: Medical history, including: Past medical problems. Family medical history. Current health, including: Emotional well-being. Home life and relationship well-being. Sexual activity. Lifestyle, including: Alcohol, nicotine or tobacco, and drug use. Access to firearms. Diet, exercise, and sleep habits. Safety issues such as seatbelt and bike helmet use. Sunscreen use. Work and work Astronomer. Physical exam Your health care provider may check your: Height and weight. These may be used to calculate your BMI (body mass index). BMI is a measurement that tells if you are at a healthy weight. Waist circumference. This measures the distance around your waistline. This measurement also tells if you are at a healthy weight and may help predict your risk of certain diseases, such as type 2 diabetes and high blood pressure. Heart rate and blood pressure. Body temperature. Skin for abnormal spots. What immunizations do I need?  Vaccines are usually given at various ages, according to a schedule. Your health care provider will recommend vaccines for you based on your age, medical history, and lifestyle or other factors, such as travel or where you work. What tests do I  need? Screening Your health care provider may recommend screening tests for certain conditions. This may include: Lipid and cholesterol levels. Diabetes screening. This is done by checking your blood sugar (glucose) after you have not eaten for a while (fasting). Hepatitis B test. Hepatitis C test. HIV (human immunodeficiency virus) test. STI (sexually transmitted infection) testing, if you are at risk. Talk with your health care provider about your test results, treatment options, and if necessary, the need for more tests. Follow these instructions at home: Eating and drinking  Eat a healthy diet that includes fresh fruits and vegetables, whole grains, lean protein, and low-fat dairy products. Drink enough fluid to keep your urine pale yellow. Take vitamin and mineral supplements as recommended by your health care provider. Do not drink alcohol if your health care provider tells you not to drink. If you drink alcohol: Limit how much you have to 0-2 drinks a day. Know how much alcohol is in your drink. In the [place] one drink equals one 12 oz bottle of beer (355 mL), one 5 oz glass of wine (148 mL), or one 1 oz glass of hard liquor (44 mL). Lifestyle Brush your teeth every morning and night with fluoride toothpaste. Floss one time each day. Exercise for at least 30 minutes 5 or more days each week. Do not use any products that contain nicotine or tobacco. These products include cigarettes, chewing tobacco, and vaping devices, such as e-cigarettes. If you need help quitting, ask your health care provider. Do not use drugs. If you are sexually active, practice safe sex. Use a condom or other form of protection to prevent STIs. Find healthy ways to manage stress,  such as: Meditation, yoga, or listening to music. Journaling. Talking to a trusted person. Spending time with friends and family. Minimize exposure to UV radiation to reduce your risk of skin cancer. Safety Always wear your  seat belt while driving or riding in a vehicle. Do not drive: If you have been drinking alcohol. Do not ride with someone who has been drinking. If you have been using any mind-altering substances or drugs. While texting. When you are tired or distracted. Wear a helmet and other protective equipment during sports activities. If you have firearms in your house, make sure you follow all gun safety procedures. Seek help if you have been physically or sexually abused. What's next? Go to your health care provider once a year for an annual wellness visit. Ask your health care provider how often you should have your eyes and teeth checked. Stay up to date on all vaccines. This information is not intended to replace advice given to you by your health care provider. Make sure you discuss any questions you have with your health care provider. Document Revised: 05/05/2021 Document Reviewed: 05/05/2021 Elsevier Patient Education  2023 [employer].

## 2022-09-23 DIAGNOSIS — M546 Pain in thoracic spine: Secondary | ICD-10-CM | POA: Diagnosis not present

## 2022-11-18 DIAGNOSIS — J029 Acute pharyngitis, unspecified: Secondary | ICD-10-CM | POA: Diagnosis not present

## 2022-11-18 DIAGNOSIS — J069 Acute upper respiratory infection, unspecified: Secondary | ICD-10-CM | POA: Diagnosis not present

## 2022-11-18 DIAGNOSIS — R509 Fever, unspecified: Secondary | ICD-10-CM | POA: Diagnosis not present

## 2022-11-18 DIAGNOSIS — R051 Acute cough: Secondary | ICD-10-CM | POA: Diagnosis not present

## 2023-01-14 ENCOUNTER — Other Ambulatory Visit: Payer: Self-pay | Admitting: Nurse Practitioner

## 2023-01-14 ENCOUNTER — Encounter: Payer: Self-pay | Admitting: Medical

## 2023-01-14 ENCOUNTER — Telehealth: Payer: BC Managed Care – PPO | Admitting: Physician Assistant

## 2023-01-14 DIAGNOSIS — R6889 Other general symptoms and signs: Secondary | ICD-10-CM

## 2023-01-14 DIAGNOSIS — L0100 Impetigo, unspecified: Secondary | ICD-10-CM | POA: Diagnosis not present

## 2023-01-14 MED ORDER — BENZONATATE 100 MG PO CAPS: 100.0000 mg | ORAL_CAPSULE | Freq: Three times a day (TID) | ORAL | 0 refills | Status: DC | PRN

## 2023-01-14 MED ORDER — MUPIROCIN 2 % EX OINT: 1.0000 | TOPICAL_OINTMENT | Freq: Two times a day (BID) | CUTANEOUS | 0 refills | Status: DC

## 2023-01-14 MED ORDER — MUPIROCIN CALCIUM 2 % EX CREA: TOPICAL_CREAM | Freq: Three times a day (TID) | CUTANEOUS | Status: DC

## 2023-01-14 NOTE — Progress Notes (Signed)
Virtual Visit Consent   Brian Carlson, you are scheduled for a virtual visit with a [facility] provider today. Just as with appointments in the office, your consent must be obtained to participate. Your consent will be active for this visit and any virtual visit you may have with one of our providers in the next 365 days. If you have a MyChart account, a copy of this consent can be sent to you electronically.  As this is a virtual visit, video technology does not allow for your provider to perform a traditional examination. This may limit your provider's ability to fully assess your condition. If your provider identifies any concerns that need to be evaluated in person or the need to arrange testing (such as labs, EKG, etc.), we will make arrangements to do so. Although advances in technology are sophisticated, we cannot ensure that it will always work on either your end or our end. If the connection with a video visit is poor, the visit may have to be switched to a telephone visit. With either a video or telephone visit, we are not always able to ensure that we have a secure connection.  By engaging in this virtual visit, you consent to the provision of healthcare and authorize for your insurance to be billed (if applicable) for the services provided during this visit. Depending on your insurance coverage, you may receive a charge related to this service.  I need to obtain your verbal consent now. Are you willing to proceed with your visit today? Brian Carlson has provided verbal consent on 01/14/2023 for a virtual visit (video or telephone). Lenise Arena Ward, PA-C  Date: 01/14/2023 11:48 AM  Virtual Visit via Video Note   I, Lenise Arena Ward, connected with  Brian Carlson  ([id number], August 23, 1986) on 01/14/23 at 11:45 AM EST by a video-enabled telemedicine application and verified that I am speaking with the correct person using two identifiers.  Location: Patient: Virtual Visit Location  Patient: Home Provider: Virtual Visit Location Provider: Home Office   I discussed the limitations of evaluation and management by telemedicine and the availability of in person appointments. The patient expressed understanding and agreed to proceed.    History of Present Illness: Brian Carlson is a 37 y.o. who identifies as a male who was assigned male at birth, and is being seen today for fever, chills, body aches since Tuesday. Reports intermittent dry cough.  Denies wheezing or shortness of breath.  Complains of a rash to the lower lip, reports vesicles with some yellow crusting.  Denies pain to rash.    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   Thoracic outlet syndrome 05/11/2015   Nonallopathic lesion of thoracic region 05/11/2015   Nonallopathic lesion of cervical region 05/11/2015   Nonallopathic lesion-rib cage 05/11/2015   C6 radiculopathy 04/23/2015    Allergies:  Allergies  Allergen Reactions   Pertussis Vaccines Other (See Comments)    unknown   Medications:  Current Outpatient Medications:    benzonatate (TESSALON) 100 MG capsule, Take 1 capsule (100 mg total) by mouth 3 (three) times daily as needed for cough., Disp: 20 capsule, Rfl: 0  Current Facility-Administered Medications:    mupirocin cream (BACTROBAN) 2 %, , Topical, TID, Ward,  Lenise Arena, PA-C  Observations/Objective: Patient is well-developed, well-nourished in no acute distress.  Resting comfortably at home.  Head is normocephalic, atraumatic.  No labored breathing.  Speech is clear and coherent with logical content.  Patient is alert and oriented at baseline.    Assessment and Plan: 1. Flu-like symptoms - benzonatate (TESSALON) 100 MG capsule; Take 1 capsule (100 mg total) by  mouth 3 (three) times daily as needed for cough.  Dispense: 20 capsule; Refill: 0  2. Impetigo - mupirocin cream (BACTROBAN) 2 %  Greater than 48 hours for flu like symptom onset, supportive care discussed.   Follow Up Instructions: I discussed the assessment and treatment plan with the patient. The patient was provided an opportunity to ask questions and all were answered. The patient agreed with the plan and demonstrated an understanding of the instructions.  A copy of instructions were sent to the patient via MyChart unless otherwise noted below.     The patient was advised to call back or seek an in-person evaluation if the symptoms worsen or if the condition fails to improve as anticipated.  Time:  I spent 10 minutes with the patient via telehealth technology discussing the above problems/concerns.    Lenise Arena Ward, PA-C

## 2023-01-14 NOTE — Patient Instructions (Signed)
  Brian Carlson, thank you for joining [place], PA-C for today's virtual visit.  While this provider is not your primary care provider (PCP), if your PCP is located in our provider database this encounter information will be shared with them immediately following your visit.   [facility] account gives you access to today's visit and all your visits, tests, and labs performed at [facility] " click here if you don't have a [facility] account or go to [email or web address]  Consent: (Patient) Brian Carlson provided verbal consent for this virtual visit at the beginning of the encounter.  Current Medications:  Current Outpatient Medications:    benzonatate (TESSALON) 100 MG capsule, Take 1 capsule (100 mg total) by mouth 3 (three) times daily as needed for cough., Disp: 20 capsule, Rfl: 0  Current Facility-Administered Medications:    mupirocin cream (BACTROBAN) 2 %, , Topical, TID, Ward, Lenise Arena, PA-C   Medications ordered in this encounter:  Meds ordered this encounter  Medications   benzonatate (TESSALON) 100 MG capsule    Sig: Take 1 capsule (100 mg total) by mouth 3 (three) times daily as needed for cough.    Dispense:  20 capsule    Refill:  0    Order Specific Question:   Supervising Provider    Answer:   Chase Picket [id number]   mupirocin cream (BACTROBAN) 2 %     *If you need refills on other medications prior to your next appointment, please contact your pharmacy*  Follow-Up: Call back or seek an in-person evaluation if the symptoms worsen or if the condition fails to improve as anticipated.  [facility] [phone]  Other Instructions Can take Tessalon as needed every 8 hours for cough.  Recommend Mucinex and Flonase for congestion and postnasal drip.    Apply mupirocin three times daily to rash. If no improvement follow up with in person evaluation with PCP or Urgent Care.    If you have been  instructed to have an in-person evaluation today at a local Urgent Care facility, please use the link below. It will take you to a list of all of our available [facility], including address, phone number and hours of operation. Please do not delay care.  [facility]  If you or a family member do not have a primary care provider, use the link below to schedule a visit and establish care. When you choose a [facility] primary care physician or advanced practice provider, you gain a long-term partner in health. Find a Primary Care Provider  Learn more about [facility]'s in-office and virtual care options: [facility]

## 2024-07-16 ENCOUNTER — Telehealth: Admitting: Physician Assistant

## 2024-07-16 DIAGNOSIS — J208 Acute bronchitis due to other specified organisms: Secondary | ICD-10-CM

## 2024-07-16 DIAGNOSIS — B9689 Other specified bacterial agents as the cause of diseases classified elsewhere: Secondary | ICD-10-CM | POA: Diagnosis not present

## 2024-07-16 MED ORDER — BENZONATATE 100 MG PO CAPS: 100.0000 mg | ORAL_CAPSULE | Freq: Three times a day (TID) | ORAL | 0 refills | Status: AC | PRN

## 2024-07-16 MED ORDER — AZITHROMYCIN 250 MG PO TABS: ORAL_TABLET | ORAL | 0 refills | Status: AC

## 2024-07-16 NOTE — Progress Notes (Signed)
 I have spent 5 minutes in review of e-visit questionnaire, review and updating patient chart, medical decision making and response to patient.   Elsie Velma Lunger, PA-C

## 2024-07-16 NOTE — Progress Notes (Signed)

## 2024-09-24 ENCOUNTER — Telehealth: Payer: Self-pay | Admitting: Physician Assistant

## 2024-09-24 DIAGNOSIS — B001 Herpesviral vesicular dermatitis: Secondary | ICD-10-CM

## 2024-09-24 DIAGNOSIS — L0100 Impetigo, unspecified: Secondary | ICD-10-CM

## 2024-09-24 MED ORDER — MUPIROCIN 2 % EX OINT: 1.0000 | TOPICAL_OINTMENT | Freq: Two times a day (BID) | CUTANEOUS | 0 refills | Status: AC

## 2024-09-24 MED ORDER — VALACYCLOVIR HCL 1 G PO TABS: 2000.0000 mg | ORAL_TABLET | Freq: Two times a day (BID) | ORAL | 0 refills | Status: AC

## 2024-09-24 NOTE — Progress Notes (Signed)
 We are sorry that you are not feeling well.  Here is how we plan to help!  Based on what you have shared with me it does look like you have a viral infection.    Most cold sores or fever blisters are small fluid filled blisters around the mouth caused by herpes simplex virus.  The most common strain of the virus causing cold sores is herpes simplex virus 1.  It can be spread by skin contact, sharing eating utensils, or even sharing towels.  Cold sores are contagious to other people until dry. (Approximately 5-7 days).  Wash your hands. You can spread the virus to your eyes through handling your contact lenses after touching the lesions.  Most people experience pain at the sight or tingling sensations in their lips that may begin before the ulcers erupt.  Herpes simplex is treatable but not curable.  It may lie dormant for a long time and then reappear due to stress or prolonged sun exposure.  Many patients have success in treating their cold sores with an over the counter topical called Abreva.  You may apply the cream up to 5 times daily (maximum 10 days) until healing occurs.  If you would like to use an oral antiviral medication to speed the healing of your cold sore, I have sent a prescription to your local pharmacy Valacyclovir 2 gm take one by mouth twice a day for 1 day.   I have also added on topical Mupirocin  to apply as directed to cover for secondary bacterial infection/impetigo.  HOME CARE:  Wash your hands frequently. Do not pick at or rub the sore. Don't open the blisters. Avoid kissing other people during this time. Avoid sharing drinking glasses, eating utensils, or razors. Do not handle contact lenses unless you have thoroughly washed your hands with soap and warm water! Avoid oral sex during this time.  Herpes from sores on your mouth can spread to your partner's genital area. Avoid contact with anyone who has eczema or a weakened immune system. Cold sores are often  triggered by exposure to intense sunlight, use a lip balm containing a sunscreen (SPF 30 or higher).  GET HELP RIGHT AWAY IF:  Blisters look infected. Blisters occur near or in the eye. Symptoms last longer than 10 days. Your symptoms become worse.  MAKE SURE YOU:  Understand these instructions. Will watch your condition. Will get help right away if you are not doing well or get worse.    Your e-visit answers were reviewed by a board certified advanced clinical practitioner to complete your personal care plan.  Depending upon the condition, your plan could have  Included both over the counter or prescription medications.    Please review your pharmacy choice.  Be sure that the pharmacy you have chosen is open so that you can pick up your prescription now.  If there is a problem you can message your provider in MyChart to have the prescription routed to another pharmacy.    Your safety is important to us .  If you have drug allergies check our prescription carefully.  For the next 24 hours you can use MyChart to ask questions about today's visit, request a non-urgent call back, or ask for a work or school excuse from your e-visit provider.  You will get an email in the next two days asking about your experience.  I hope that your e-visit has been valuable and will speed your recovery.   I have spent 9 minutes  in review of e-visit questionnaire, review and updating patient chart, medical decision making and response to patient.   Elsie Velma Lunger, PA-C

## 2024-09-24 NOTE — Progress Notes (Signed)
 Message sent to patient requesting further input regarding current symptoms. Awaiting patient response.
